# Patient Record
Sex: Male | Born: 1970 | Race: White | Hispanic: No | Marital: Married | Smoking: Light tobacco smoker
Health system: Southern US, Community
[De-identification: ages and names within clinical notes are randomized; demographics above are authoritative.]

## PROBLEM LIST (undated history)

## (undated) DIAGNOSIS — E785 Hyperlipidemia, unspecified: Secondary | ICD-10-CM

## (undated) DIAGNOSIS — I10 Essential (primary) hypertension: Secondary | ICD-10-CM

## (undated) DIAGNOSIS — E119 Type 2 diabetes mellitus without complications: Secondary | ICD-10-CM

## (undated) DIAGNOSIS — Q539 Undescended testicle, unspecified: Secondary | ICD-10-CM

## (undated) HISTORY — DX: Hyperlipidemia, unspecified: E78.5

## (undated) HISTORY — DX: Undescended testicle, unspecified: Q53.9

## (undated) HISTORY — DX: Type 2 diabetes mellitus without complications: E11.9

## (undated) HISTORY — DX: Essential (primary) hypertension: I10

## (undated) HISTORY — PX: TESTICLE SURGERY: SHX794

## (undated) HISTORY — PX: OTHER SURGICAL HISTORY: SHX169

---

## 2007-03-02 ENCOUNTER — Ambulatory Visit: Payer: Self-pay | Admitting: Family Medicine

## 2007-03-02 DIAGNOSIS — E781 Pure hyperglyceridemia: Secondary | ICD-10-CM | POA: Insufficient documentation

## 2007-03-02 DIAGNOSIS — H5310 Unspecified subjective visual disturbances: Secondary | ICD-10-CM | POA: Insufficient documentation

## 2007-03-09 ENCOUNTER — Encounter: Payer: Self-pay | Admitting: Family Medicine

## 2007-03-13 ENCOUNTER — Encounter: Payer: Self-pay | Admitting: Family Medicine

## 2007-03-13 LAB — CONVERTED CEMR LAB
ALT: 97 units/L — ABNORMAL HIGH (ref 0–53)
CO2: 23 meq/L (ref 19–32)
Calcium: 9.1 mg/dL (ref 8.4–10.5)
Chloride: 107 meq/L (ref 96–112)
Cholesterol: 219 mg/dL — ABNORMAL HIGH (ref 0–200)
Sodium: 142 meq/L (ref 135–145)
TSH: 2.992 microintl units/mL (ref 0.350–5.50)
Total Bilirubin: 0.5 mg/dL (ref 0.3–1.2)
Total Protein: 7 g/dL (ref 6.0–8.3)

## 2007-03-15 ENCOUNTER — Telehealth: Payer: Self-pay | Admitting: Family Medicine

## 2008-08-11 ENCOUNTER — Ambulatory Visit: Payer: Self-pay | Admitting: Family Medicine

## 2008-08-11 DIAGNOSIS — I10 Essential (primary) hypertension: Secondary | ICD-10-CM | POA: Insufficient documentation

## 2008-08-12 ENCOUNTER — Encounter: Payer: Self-pay | Admitting: Family Medicine

## 2008-08-12 LAB — CONVERTED CEMR LAB
ALT: 134 units/L — ABNORMAL HIGH (ref 0–53)
Albumin: 4.6 g/dL (ref 3.5–5.2)
Alkaline Phosphatase: 58 units/L (ref 39–117)
Glucose, Bld: 107 mg/dL — ABNORMAL HIGH (ref 70–99)
Potassium: 4.6 meq/L (ref 3.5–5.3)
Sodium: 142 meq/L (ref 135–145)
TSH: 1.867 microintl units/mL (ref 0.350–4.500)
Total Protein: 7.6 g/dL (ref 6.0–8.3)
Triglycerides: 557 mg/dL — ABNORMAL HIGH (ref <150)

## 2008-08-13 LAB — CONVERTED CEMR LAB
Hep A IgM: NEGATIVE
Hep B C IgM: NEGATIVE
Hepatitis B Surface Ag: NEGATIVE

## 2008-08-22 ENCOUNTER — Encounter: Payer: Self-pay | Admitting: Family Medicine

## 2008-08-25 ENCOUNTER — Telehealth: Payer: Self-pay | Admitting: Family Medicine

## 2008-12-29 ENCOUNTER — Encounter: Admission: RE | Admit: 2008-12-29 | Discharge: 2008-12-29 | Payer: Self-pay | Admitting: Family Medicine

## 2008-12-29 ENCOUNTER — Ambulatory Visit: Payer: Self-pay | Admitting: Family Medicine

## 2008-12-29 DIAGNOSIS — L259 Unspecified contact dermatitis, unspecified cause: Secondary | ICD-10-CM

## 2008-12-29 DIAGNOSIS — R05 Cough: Secondary | ICD-10-CM

## 2009-02-27 ENCOUNTER — Ambulatory Visit: Payer: Self-pay | Admitting: Family Medicine

## 2009-02-27 DIAGNOSIS — IMO0001 Reserved for inherently not codable concepts without codable children: Secondary | ICD-10-CM | POA: Insufficient documentation

## 2009-02-27 DIAGNOSIS — M2240 Chondromalacia patellae, unspecified knee: Secondary | ICD-10-CM

## 2011-06-16 ENCOUNTER — Telehealth: Payer: Self-pay | Admitting: Family Medicine

## 2011-06-16 ENCOUNTER — Encounter: Payer: Self-pay | Admitting: Family Medicine

## 2011-06-16 ENCOUNTER — Ambulatory Visit (INDEPENDENT_AMBULATORY_CARE_PROVIDER_SITE_OTHER): Payer: BC Managed Care – PPO | Admitting: Family Medicine

## 2011-06-16 VITALS — BP 150/101 | HR 83 | Wt 311.0 lb

## 2011-06-16 DIAGNOSIS — E669 Obesity, unspecified: Secondary | ICD-10-CM

## 2011-06-16 DIAGNOSIS — R5383 Other fatigue: Secondary | ICD-10-CM

## 2011-06-16 DIAGNOSIS — M25529 Pain in unspecified elbow: Secondary | ICD-10-CM

## 2011-06-16 DIAGNOSIS — F39 Unspecified mood [affective] disorder: Secondary | ICD-10-CM

## 2011-06-16 DIAGNOSIS — R5381 Other malaise: Secondary | ICD-10-CM

## 2011-06-16 DIAGNOSIS — M7710 Lateral epicondylitis, unspecified elbow: Secondary | ICD-10-CM

## 2011-06-16 NOTE — Telephone Encounter (Signed)
Call patient. He did screen positive on the mood questionnaire for depression. In fact he scored in the moderately severe range. I would like to discuss this further at his followup visit in 3-4 weeks or he can certainly come in sooner if he would like.

## 2011-06-16 NOTE — Progress Notes (Signed)
Subjective:    Patient ID: Adam Santos, male    DOB: 27-Jul-1970, 41 y.o.   MRN: [id number]  HPI Pain in the left elbow for about 2 months.Says worse with lifting milk jug out of the fridge.   Says it does not bother him constantly that comes and goes. It seems to bother him when he just does a certain turning or twisting movement of his arm and then it will certainly be a very severe pain to the point of bringing him almost to tears. He says he has a very high pain tolerance for him this is very unusual. He has not really been taking any medications for it. He denies any actual trauma or impact injury to the elbow.   has had 2 episodes of eyelid swelling after exercise. Last time was two weeks ago. Swelling resolved by next day.  No itching.  No CP or SOB. Did use benadryl after the swelling. 100% back to normal. No hx of allergies, eczema, or asthma.  He denies any abnormal contact with chemicals etc. to the eye.  Weight gain - Tried eating differently and trying to exercise but still only able to lose a few pounds. He feels like it should be coming off more quickly he is not sure why. She wonders if there could be an underlying reason for why he cannot lose weight.. He does complain of Fatigue. Has dec mood but says it is intermittent. He says he will have several days in a row where he feels down and apathetic and doesn't want to work but then the next day he feels like he snaps out of it.. Wants test checked.  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   Review of Systems BP 150/101  Pulse 83  Wt 311 lb (141.069 kg)    Allergies not on file  Past Medical History  Diagnosis Date  . Hyperlipidemia     Past Surgical History  Procedure Date  . Tonsills removed     History   Social History  . Marital Status: Married    Spouse Name: N/A   Number of Children: N/A  . Years of Education: N/A   Occupational History  . Not on file.   Social History Main Topics  . Smoking status: Never Smoker   . Smokeless tobacco: Not on file  . Alcohol Use: Yes  . Drug Use: No  . Sexually Active: Yes -- Male partner(s)   Other Topics Concern  . Not on file   Social History Narrative  . No narrative on file    Family History  Problem Relation Age of Onset  . Heart attack Father   . Hypertension Father         Objective:   Physical Exam  Constitutional: He appears well-developed and well-nourished.  Musculoskeletal:       He is very tender over the left lateral epicondyle. He is also tender a little bit along the tendon attaches. He has pain with supination against resistance. He also has pain with dorsiflexion of the hand against resistance. He has no pinpoint tenderness or swelling over the actual olecranon. No skin lesions or erythema. Normal range of motion of the shoulder, elbow, wrist.  Psychiatric: He has a normal mood and affect. His behavior is normal.  Assessment & Plan:  Left lateral epicondulitis - Discussed tx with NSAIDs, ice, stretches and strap. H.O. Given. F/U in 3-4 weeks.   Obesity - Discussed will check TSH and testorone. If normal consider a weight looss program such at [employer] , personal trainer or wokring with a nutritionist.  It is difficult to assess truly his effort at this point. I certainly think that some mild depression could be contributing.   It sounds like his recent apathy could be some mild depression. I did have him complete a PHQ 9 today and he scored an 18. This is significant for moderately severe depression. I would like to discuss this further with him and his followup in 3-4 weeks.  Fatigue-we will certainly check lab work such as TSH and testosterone but I certainly think that he could be coming from depression. I did have him complete a testosterone questionnaire and  he scored positive for 5/ 10 questions today.

## 2011-06-16 NOTE — Patient Instructions (Addendum)
Aleve twice a day with food and water to avoid GI symptoms Ice twice a day. Can wear a strap.   Lateral Epicondylitis (Tennis Elbow) with Rehab Lateral epicondylitis involves inflammation and pain around the outer portion of the elbow. The pain is caused by inflammation of the tendons in the forearm that bring back (extend) the wrist. Lateral epicondylittis is also called tennis elbow, because it is very common in tennis players. However, it may occur in any individual who extends the wrist repetitively. If lateral epicondylitis is left untreated, it may become a chronic problem. SYMPTOMS    Pain, tenderness, and inflammation on the outer (lateral) side of the elbow.     Pain or weakness with gripping activities.     Pain that increases with wrist twisting motions (playing tennis, using a screwdriver, opening a door or a jar).     Pain with lifting objects, including a coffee cup.  CAUSES   Lateral epicondylitis is caused by inflammation of the tendons that extend the wrist. Causes of injury may include:  Repetitive stress and strain on the muscles and tendons that extend the wrist.     Sudden change in activity level or intensity.     Incorrect grip in racquet sports.     Incorrect grip size of racquet (often too large).     Incorrect hitting position or technique (usually backhand, leading with the elbow).     Using a racket that is too heavy.  RISK INCREASES WITH:  Sports or occupations that require repetitive and/or strenuous forearm and wrist movements (tennis, squash, racquetball, carpentry).     Poor wrist and forearm strength and flexibility.     Failure to warm up properly before activity.     Resuming activity before healing, rehabilitation, and conditioning are complete.  PREVENTION    Warm up and stretch properly before activity.     Maintain physical fitness:     Strength, flexibility, and endurance.     Cardiovascular fitness.     Wear and use properly  fitted equipment.     Learn and use proper technique and have a coach correct improper technique.     Wear a tennis elbow (counterforce) brace.  PROGNOSIS   The course of this condition depends on the degree of the injury. If treated properly, acute cases (symptoms lasting less than 4 weeks) are often resolved in 2 to 6 weeks. Chronic (longer lasting cases) often resolve in 3 to 6 months, but may require physical therapy. RELATED COMPLICATIONS    Frequently recurring symptoms, resulting in a chronic problem. Properly treating the problem the first time decreases frequency of recurrence.     Chronic inflammation, scarring tendon degeneration, and partial tendon tear, requiring surgery.     Delayed healing or resolution of symptoms.  TREATMENT   Treatment first involves the use of ice and medicine, to reduce pain and inflammation. Strengthening and stretching exercises may help reduce discomfort, if performed regularly. These exercises may be performed at home, if the condition is an acute injury. Chronic cases may require a referral to a physical therapist for evaluation and treatment. Your caregiver may advise a corticosteroid injection, to help reduce inflammation. Rarely, surgery is needed. MEDICATION  If pain medicine is needed, nonsteroidal anti-inflammatory medicines (aspirin and ibuprofen), or other minor pain relievers (acetaminophen), are often advised.     Do not take pain medicine for 7 days before surgery.     Prescription pain relievers may be given, if your caregiver thinks  they are needed. Use only as directed and only as much as you need.     Corticosteroid injections may be recommended. These injections should be reserved only for the most severe cases, because they can only be given a certain number of times.  HEAT AND COLD  Cold treatment (icing) should be applied for 10 to 15 minutes every 2 to 3 hours for inflammation and pain, and immediately after activity that  aggravates your symptoms. Use ice packs or an ice massage.     Heat treatment may be used before performing stretching and strengthening activities prescribed by your caregiver, physical therapist, or athletic trainer. Use a heat pack or a warm water soak.  SEEK MEDICAL CARE IF: Symptoms get worse or do not improve in 2 weeks, despite treatment. EXERCISES   RANGE OF MOTION (ROM) AND STRETCHING EXERCISES - Epicondylitis, Lateral (Tennis Elbow) These exercises may help you when beginning to rehabilitate your injury. Your symptoms may go away with or without further involvement from your physician, physical therapist or athletic trainer. While completing these exercises, remember:    Restoring tissue flexibility helps normal motion to return to the joints. This allows healthier, less painful movement and activity.     An effective stretch should be held for at least 30 seconds.     A stretch should never be painful. You should only feel a gentle lengthening or release in the stretched tissue.  RANGE OF MOTION - Wrist Flexion, Active-Assisted  Extend your right / left elbow with your fingers pointing down.*     Gently pull the back of your hand towards you, until you feel a gentle stretch on the top of your forearm.     Hold this position for __________ seconds.  Repeat __________ times. Complete this exercise __________ times per day.   *If directed by your physician, physical therapist or athletic trainer, complete this stretch with your elbow bent, rather than extended. RANGE OF MOTION - Wrist Extension, Active-Assisted  Extend your right / left elbow and turn your palm upwards.*     Gently pull your palm and fingertips back, so your wrist extends and your fingers point more toward the ground.     You should feel a gentle stretch on the inside of your forearm.     Hold this position for __________ seconds.  Repeat __________ times. Complete this exercise __________ times per day. *If  directed by your physician, physical therapist or athletic trainer, complete this stretch with your elbow bent, rather than extended. STRETCH - Wrist Flexion  Place the back of your right / left hand on a tabletop, leaving your elbow slightly bent. Your fingers should point away from your body.     Gently press the back of your hand down onto the table by straightening your elbow. You should feel a stretch on the top of your forearm.     Hold this position for __________ seconds.  Repeat __________ times. Complete this stretch __________ times per day.   STRETCH - Wrist Extension   Place your right / left fingertips on a tabletop, leaving your elbow slightly bent. Your fingers should point backwards.     Gently press your fingers and palm down onto the table by straightening your elbow. You should feel a stretch on the inside of your forearm.     Hold this position for __________ seconds.  Repeat __________ times. Complete this stretch __________ times per day.   STRENGTHENING EXERCISES - Epicondylitis, Lateral (Tennis Elbow)  These exercises may help you when beginning to rehabilitate your injury. They may resolve your symptoms with or without further involvement from your physician, physical therapist or athletic trainer. While completing these exercises, remember:    Muscles can gain both the endurance and the strength needed for everyday activities through controlled exercises.     Complete these exercises as instructed by your physician, physical therapist or athletic trainer. Increase the resistance and repetitions only as guided.     You may experience muscle soreness or fatigue, but the pain or discomfort you are trying to eliminate should never worsen during these exercises. If this pain does get worse, stop and make sure you are following the directions exactly. If the pain is still present after adjustments, discontinue the exercise until you can discuss the trouble with your  caregiver.  STRENGTH - Wrist Flexors  Sit with your right / left forearm palm-up and fully supported on a table or countertop. Your elbow should be resting below the height of your shoulder. Allow your wrist to extend over the edge of the surface.     Loosely holding a __________ weight, or a piece of rubber exercise band or tubing, slowly curl your hand up toward your forearm.     Hold this position for __________ seconds. Slowly lower the wrist back to the starting position in a controlled manner.  Repeat __________ times. Complete this exercise __________ times per day.   STRENGTH - Wrist Extensors  Sit with your right / left forearm palm-down and fully supported on a table or countertop. Your elbow should be resting below the height of your shoulder. Allow your wrist to extend over the edge of the surface.     Loosely holding a __________ weight, or a piece of rubber exercise band or tubing, slowly curl your hand up toward your forearm.     Hold this position for __________ seconds. Slowly lower the wrist back to the starting position in a controlled manner.  Repeat __________ times. Complete this exercise __________ times per day.   STRENGTH - Ulnar Deviators  Stand with a ____________________ weight in your right / left hand, or sit while holding a rubber exercise band or tubing, with your healthy arm supported on a table or countertop.     Move your wrist, so that your pinkie travels toward your forearm and your thumb moves away from your forearm.     Hold this position for __________ seconds and then slowly lower the wrist back to the starting position.  Repeat __________ times. Complete this exercise __________ times per day STRENGTH - Radial Deviators  Stand with a ____________________ weight in your right / left hand, or sit while holding a rubber exercise band or tubing, with your injured arm supported on a table or countertop.     Raise your hand upward in front of you or  pull up on the rubber tubing.     Hold this position for __________ seconds and then slowly lower the wrist back to the starting position.  Repeat __________ times. Complete this exercise __________ times per day. STRENGTH - Forearm Supinators   Sit with your right / left forearm supported on a table, keeping your elbow below shoulder height. Rest your hand over the edge, palm down.     Gently grip a hammer or a soup ladle.     Without moving your elbow, slowly turn your palm and hand upward to a "thumbs-up" position.     Hold this  position for __________ seconds. Slowly return to the starting position.  Repeat __________ times. Complete this exercise __________ times per day.   STRENGTH - Forearm Pronators   Sit with your right / left forearm supported on a table, keeping your elbow below shoulder height. Rest your hand over the edge, palm up.     Gently grip a hammer or a soup ladle.     Without moving your elbow, slowly turn your palm and hand upward to a "thumbs-up" position.     Hold this position for __________ seconds. Slowly return to the starting position.  Repeat __________ times. Complete this exercise __________ times per day.   STRENGTH - Grip  Grasp a tennis ball, a dense sponge, or a large, rolled sock in your hand.     Squeeze as hard as you can, without increasing any pain.     Hold this position for __________ seconds. Release your grip slowly.  Repeat __________ times. Complete this exercise __________ times per day.   STRENGTH - Elbow Extensors, Isometric  Stand or sit upright, on a firm surface. Place your right / left arm so that your palm faces your stomach, and it is at the height of your waist.     Place your opposite hand on the underside of your forearm. Gently push up as your right / left arm resists. Push as hard as you can with both arms, without causing any pain or movement at your right / left elbow. Hold this stationary position for __________  seconds.  Gradually release the tension in both arms. Allow your muscles to relax completely before repeating. Document Released: 05/02/2005 Document Revised: 01/12/2011 Document Reviewed: 08/14/2008 [facility] Patient Information 2012 [place], [place].

## 2011-06-17 LAB — TESTOSTERONE, FREE, TOTAL, SHBG
Testosterone, Free: 59 pg/mL (ref 47.0–244.0)
Testosterone-% Free: 2.8 % (ref 1.6–2.9)

## 2011-06-20 NOTE — Telephone Encounter (Signed)
Pt has been notified of this and will discuss at f/u appt

## 2011-06-27 ENCOUNTER — Telehealth: Payer: Self-pay | Admitting: *Deleted

## 2011-06-27 DIAGNOSIS — M25529 Pain in unspecified elbow: Secondary | ICD-10-CM

## 2011-06-27 NOTE — Telephone Encounter (Signed)
Pt informed

## 2011-06-27 NOTE — Telephone Encounter (Signed)
Pt states that he has had constant pain x4 days in his elbow and would like to get an Xray done.

## 2011-06-27 NOTE — Telephone Encounter (Signed)
Order in computer. He can go anytime.

## 2011-06-28 ENCOUNTER — Ambulatory Visit
Admission: RE | Admit: 2011-06-28 | Discharge: 2011-06-28 | Disposition: A | Discharge status: Home / Self Care | Payer: BC Managed Care – PPO | Source: Ambulatory Visit | Attending: Family Medicine | Admitting: Family Medicine

## 2011-06-28 DIAGNOSIS — M7710 Lateral epicondylitis, unspecified elbow: Secondary | ICD-10-CM

## 2011-06-28 DIAGNOSIS — E669 Obesity, unspecified: Secondary | ICD-10-CM

## 2011-06-28 DIAGNOSIS — M25529 Pain in unspecified elbow: Secondary | ICD-10-CM

## 2011-06-28 DIAGNOSIS — R5383 Other fatigue: Secondary | ICD-10-CM

## 2011-06-30 ENCOUNTER — Telehealth: Payer: Self-pay | Admitting: *Deleted

## 2011-06-30 ENCOUNTER — Other Ambulatory Visit: Payer: Self-pay | Admitting: Family Medicine

## 2011-07-04 LAB — TESTOSTERONE, FREE, TOTAL, SHBG
Sex Hormone Binding: 19 nmol/L (ref 13–71)
Testosterone, Free: 56.7 pg/mL (ref 47.0–244.0)
Testosterone-% Free: 2.5 % (ref 1.6–2.9)
Testosterone: 222.96 ng/dL — ABNORMAL LOW (ref 250–890)

## 2011-07-08 ENCOUNTER — Ambulatory Visit (INDEPENDENT_AMBULATORY_CARE_PROVIDER_SITE_OTHER): Payer: BC Managed Care – PPO | Admitting: Family Medicine

## 2011-07-08 ENCOUNTER — Ambulatory Visit
Admission: RE | Admit: 2011-07-08 | Discharge: 2011-07-08 | Disposition: A | Discharge status: Home / Self Care | Payer: BC Managed Care – PPO | Source: Ambulatory Visit | Attending: Family Medicine | Admitting: Family Medicine

## 2011-07-08 ENCOUNTER — Encounter: Payer: Self-pay | Admitting: Family Medicine

## 2011-07-08 VITALS — BP 139/90 | HR 84 | Ht 74.0 in | Wt 312.0 lb

## 2011-07-08 DIAGNOSIS — M25572 Pain in left ankle and joints of left foot: Secondary | ICD-10-CM

## 2011-07-08 DIAGNOSIS — M7710 Lateral epicondylitis, unspecified elbow: Secondary | ICD-10-CM

## 2011-07-08 DIAGNOSIS — M79673 Pain in unspecified foot: Secondary | ICD-10-CM

## 2011-07-08 DIAGNOSIS — M25579 Pain in unspecified ankle and joints of unspecified foot: Secondary | ICD-10-CM

## 2011-07-08 DIAGNOSIS — M7712 Lateral epicondylitis, left elbow: Secondary | ICD-10-CM

## 2011-07-08 DIAGNOSIS — E291 Testicular hypofunction: Secondary | ICD-10-CM

## 2011-07-08 DIAGNOSIS — M2550 Pain in unspecified joint: Secondary | ICD-10-CM

## 2011-07-08 DIAGNOSIS — M79609 Pain in unspecified limb: Secondary | ICD-10-CM

## 2011-07-08 MED ORDER — TESTOSTERONE 20.25 MG/1.25GM (1.62%) TD GEL: 2.0000 | TRANSDERMAL | Status: DC

## 2011-07-08 NOTE — Patient Instructions (Signed)
We will call you with your lab results. If you don't here from us in about a week then please give us a call at [phone].  

## 2011-07-08 NOTE — Progress Notes (Signed)
  Subjective:    Patient ID: JAMAINE QUINTIN, male    DOB: 05-22-1970, 41 y.o.   MRN: [id number]  HPI Left elbow pain. Tried Aleve and the elbow strap - they are not helping.  Did an xray and he had a bone spur. Exercises were making worse.   Woke up pain in his left foot AM. Mostly over the  the anterior hee and wraps around the the lateral malleolus.  . IT is mildy swollen and tender.  No redness. Hasn't had this before. Has not bee exercising or walking a lot.  Did wear dress shoes for 3 days in a row.  It was hard to walk on it this AM.  Took Alevel this AM.   Hypogonadism-he's also to review his lab results. He had 2 tests with low testosterone levels. He has been feeling fatigued low energy and decreased mood. Given copy of labs.   Review of Systems     Objective:   Physical Exam Lelft foot with tenderness along the anterior edge of heel and anterior to the lateral malleolus. It does feel warm to touch near the malleolus. Trace swelling laterally. No redness or wounds.         Assessment & Plan:  Left lateral epicondylitis with bone spur. Will refer to ortho/sport med for further evlauation and treatment.since not improving with conservative tx. Stop the exercises since exacerbating his sxs.   Left ankle/heel pain - will get xray to rule out stress fracture. Also will check uric acid and sed rate. Consider gout. Continue to use Aleve prn. Also consider plantar fascitis.  He has only had pain for one days so time may reveal a more precise diagnosis.   Hypogonadism - discussed options for testosterone replacement including the topical gels and the injections. I discussed the AndroGel and Axiron . He decided to start with the AndroGel. He was given a coupon card. F/u in 8 weeks to retest testosterone and PSA and liver function. We discussed the risks and benefits of the medication.Chec PSA today.

## 2011-07-11 ENCOUNTER — Telehealth: Payer: Self-pay | Admitting: *Deleted

## 2011-07-11 ENCOUNTER — Other Ambulatory Visit: Payer: Self-pay | Admitting: Family Medicine

## 2011-07-11 DIAGNOSIS — M79673 Pain in unspecified foot: Secondary | ICD-10-CM

## 2011-07-11 MED ORDER — HYDROCODONE-ACETAMINOPHEN 5-325 MG PO TABS: 2.0000 | ORAL_TABLET | Freq: Three times a day (TID) | ORAL | Status: DC | PRN

## 2011-07-11 NOTE — Telephone Encounter (Signed)
Pt states that he is having lots of pain in his elbow and doesn't see ortho until Friday. Would like to know if we can call him something for pain.

## 2011-07-11 NOTE — Telephone Encounter (Signed)
rx printed  Will fax

## 2011-07-12 NOTE — Telephone Encounter (Signed)
Pt notified med sent via VM. KJ LPN

## 2011-07-14 ENCOUNTER — Ambulatory Visit: Payer: BC Managed Care – PPO | Admitting: Family Medicine

## 2011-07-15 ENCOUNTER — Other Ambulatory Visit: Payer: BC Managed Care – PPO

## 2011-07-15 ENCOUNTER — Other Ambulatory Visit: Payer: Self-pay | Admitting: Orthopedic Surgery

## 2011-07-15 DIAGNOSIS — M25522 Pain in left elbow: Secondary | ICD-10-CM

## 2011-07-19 ENCOUNTER — Other Ambulatory Visit: Payer: Self-pay | Admitting: *Deleted

## 2011-07-19 MED ORDER — HYDROCODONE-ACETAMINOPHEN 5-325 MG PO TABS: 2.0000 | ORAL_TABLET | Freq: Three times a day (TID) | ORAL | Status: DC | PRN

## 2011-07-19 NOTE — Telephone Encounter (Signed)
Rx printed and faxed.  

## 2011-07-19 NOTE — Telephone Encounter (Signed)
Pt calls and states going out of town leaving tomorrow morning and wanted to know if he could get a refill on the pain med. He only has 2 left and his surgery is scheduled March 20th.

## 2011-07-19 NOTE — Telephone Encounter (Signed)
Ok to refill 

## 2011-07-25 ENCOUNTER — Telehealth: Payer: Self-pay | Admitting: *Deleted

## 2011-07-25 MED ORDER — HYDROCODONE-ACETAMINOPHEN 5-325 MG PO TABS: 2.0000 | ORAL_TABLET | Freq: Three times a day (TID) | ORAL | Status: DC | PRN

## 2011-07-25 NOTE — Telephone Encounter (Signed)
OK for one more refill.

## 2011-07-25 NOTE — Telephone Encounter (Signed)
Pt states he would like a refill on Norco. States that he is having surgery on the 21st and is out of pain meds. May we refill them?

## 2011-07-25 NOTE — Telephone Encounter (Signed)
RX printed 

## 2011-08-01 ENCOUNTER — Other Ambulatory Visit: Payer: Self-pay | Admitting: *Deleted

## 2011-08-01 MED ORDER — HYDROCODONE-ACETAMINOPHEN 5-325 MG PO TABS: 2.0000 | ORAL_TABLET | Freq: Four times a day (QID) | ORAL | Status: AC | PRN

## 2011-08-01 NOTE — Telephone Encounter (Signed)
Pt informed

## 2011-08-01 NOTE — Telephone Encounter (Signed)
New rx sent to take 4 a day.

## 2011-08-01 NOTE — Telephone Encounter (Signed)
Pt has called requesting another refill on the pain med. States he is having surgery on Thursday. Last refill done on 07/25/11 for 20 pills. please advise.

## 2011-11-15 ENCOUNTER — Other Ambulatory Visit: Payer: Self-pay | Admitting: Family Medicine

## 2011-12-17 ENCOUNTER — Other Ambulatory Visit: Payer: Self-pay | Admitting: Family Medicine

## 2012-01-17 ENCOUNTER — Other Ambulatory Visit: Payer: Self-pay | Admitting: Family Medicine

## 2012-01-17 MED ORDER — TESTOSTERONE 20.25 MG/ACT (1.62%) TD GEL: 2.0000 | Freq: Every morning | TRANSDERMAL | Status: DC

## 2012-01-18 ENCOUNTER — Ambulatory Visit (INDEPENDENT_AMBULATORY_CARE_PROVIDER_SITE_OTHER): Payer: BC Managed Care – PPO | Admitting: Family Medicine

## 2012-01-18 ENCOUNTER — Other Ambulatory Visit: Payer: Self-pay | Admitting: Family Medicine

## 2012-01-18 ENCOUNTER — Encounter: Payer: Self-pay | Admitting: Family Medicine

## 2012-01-18 ENCOUNTER — Ambulatory Visit (INDEPENDENT_AMBULATORY_CARE_PROVIDER_SITE_OTHER): Payer: BC Managed Care – PPO

## 2012-01-18 VITALS — BP 160/108 | HR 72 | Wt 310.0 lb

## 2012-01-18 DIAGNOSIS — M25529 Pain in unspecified elbow: Secondary | ICD-10-CM

## 2012-01-18 DIAGNOSIS — M25521 Pain in right elbow: Secondary | ICD-10-CM

## 2012-01-18 DIAGNOSIS — E785 Hyperlipidemia, unspecified: Secondary | ICD-10-CM

## 2012-01-18 DIAGNOSIS — R030 Elevated blood-pressure reading, without diagnosis of hypertension: Secondary | ICD-10-CM

## 2012-01-18 DIAGNOSIS — E291 Testicular hypofunction: Secondary | ICD-10-CM

## 2012-01-18 DIAGNOSIS — I10 Essential (primary) hypertension: Secondary | ICD-10-CM

## 2012-01-18 MED ORDER — LISINOPRIL 20 MG PO TABS: 20.0000 mg | ORAL_TABLET | Freq: Every day | ORAL | Status: DC

## 2012-01-18 NOTE — Patient Instructions (Addendum)
We will call you with your lab results. If you don't here from us in about a week then please give us a call at [phone].  

## 2012-01-18 NOTE — Progress Notes (Signed)
Subjective:    Patient ID: Adam Santos, male    DOB: 21-Dec-1970, 41 y.o.   MRN: [id number]  HPI Hypogonadism - No SE on the androgel.  Says thought he would lose weight.  Not exercising.  Drinking about 4-5 days per week., 2-3 drinks liquor. Feels angry all the time. Does have  A high stress job.  Says not change in level of stress for work.  He is feeling apathetic. Denies feeling sad per se. Sleep is okay. He does not feel motivated to exercise. He overall feels that he does eat healthy except for the recent increase in alcohol intake.  HTN - No CP or SOb. No dizziness.  Taking medications regularly.  Right elbow pain-see below.   Review of Systems  BP 160/108  Pulse 72  Wt 310 lb (140.615 kg)    No Known Allergies  Past Medical History  Diagnosis Date  . Hyperlipidemia     Past Surgical History  Procedure Date  . Tonsills removed     History   Social History  . Marital Status: Married    Spouse Name: N/A    Number of Children: N/A  . Years of Education: N/A   Occupational History  . Not on file.   Social History Main Topics  . Smoking status: Never Smoker   . Smokeless tobacco: Not on file  . Alcohol Use: Yes  . Drug Use: No  . Sexually Active: Yes -- Male partner(s)   Other Topics Concern  . Not on file   Social History Narrative  . No narrative on file    Family History  Problem Relation Age of Onset  . Heart attack Father   . Hypertension Father     Outpatient Encounter Prescriptions as of 01/18/2012  Medication Sig Dispense Refill  . HYDROcodone-acetaminophen (NORCO) 5-325 MG per tablet Take 2 tablets by mouth every 6 (six) hours as needed for pain.  20 tablet  0  . Testosterone (ANDROGEL PUMP) 20.25 MG/ACT (1.62%) GEL Apply 2 Act topically every morning.  75 g  0  . Testosterone (ANDROGEL) 20.25 MG/1.25GM (1.62%) GEL Place 2 Act onto the skin every morning.  1.25 g  3  . lisinopril (PRINIVIL,ZESTRIL) 20 MG tablet Take 1 tablet (20 mg total)  by mouth daily.  30 tablet  1          Objective:   Physical Exam  Constitutional: He is oriented to person, place, and time. He appears well-developed and well-nourished.  HENT:  Head: Normocephalic and atraumatic.  Neck: Neck supple. No thyromegaly present.  Cardiovascular: Normal rate, regular rhythm and normal heart sounds.   Pulmonary/Chest: Effort normal and breath sounds normal.  Lymphadenopathy:    He has no cervical adenopathy.  Neurological: He is alert and oriented to person, place, and time.  Skin: Skin is warm and dry.  Psychiatric: He has a normal mood and affect. His behavior is normal.          Assessment & Plan:  HTN- He really doesn't want to take a BP pill.  He says he really wants to work on diet and exercise.  I explained that o has been up for over 6 months and that causes damage over time.  I did discuss starting a medication as he continues to work on diet exercise and weight loss then we can wean the medication if tolerated. We will start with lisinopril. Followup in 3-4 weeks. I think part of his lack of motivation to  exercise is from his adenopathy.  Irritability/apathy- May be from the testosterone vs depression. We discussed the option of stopping the testosterone if his level is actually normal. If his mood does not improve off of the testosterone then consider therapy or medication to treat depression.  Right elbow Pain - he is now having right elbow pain. Previously he had left elbow pain and we found a bone spur on x-ray. He was referred to orthopedist and he actually had surgery. A couple weeks ago he started having very similar pain in the right elbow. He would like to go back and see Dr. Orlan Leavens for further workup and evaluation. He says he would like to go ahead and get an x-ray today.  Hypogonadism-certainly that is her ability to be coming from the hormone itself. I like to recheck his testosterone level today. He did apply the gel today. If his  levels are too high we can decrease the dose and see if that makes a difference. If his levels are completely normal then I recommend stopping the supplement.

## 2012-01-19 ENCOUNTER — Telehealth: Payer: Self-pay | Admitting: *Deleted

## 2012-01-19 LAB — LIPID PANEL
Cholesterol: 201 mg/dL — ABNORMAL HIGH (ref 0–200)
HDL: 43 mg/dL (ref >39)
Total CHOL/HDL Ratio: 4.7 Ratio

## 2012-01-19 LAB — COMPLETE METABOLIC PANEL WITH GFR
AST: 39 U/L — ABNORMAL HIGH (ref 0–37)
Albumin: 4.3 g/dL (ref 3.5–5.2)
Alkaline Phosphatase: 56 U/L (ref 39–117)
GFR, Est Non African American: 75 mL/min
Potassium: 4.6 mEq/L (ref 3.5–5.3)
Sodium: 139 mEq/L (ref 135–145)
Total Bilirubin: 0.9 mg/dL (ref 0.3–1.2)
Total Protein: 7 g/dL (ref 6.0–8.3)

## 2012-01-19 LAB — TESTOSTERONE, FREE, TOTAL, SHBG
Sex Hormone Binding: 19 nmol/L (ref 13–71)
Testosterone, Free: 48.1 pg/mL (ref 47.0–244.0)
Testosterone: 191.07 ng/dL — ABNORMAL LOW (ref 300–890)

## 2012-01-19 LAB — LDL CHOLESTEROL, DIRECT: Direct LDL: 65 mg/dL

## 2012-01-19 LAB — TSH: TSH: 1.491 u[IU]/mL (ref 0.350–4.500)

## 2012-01-19 NOTE — Telephone Encounter (Signed)
Pt notified of results and added direct LDL

## 2012-02-01 ENCOUNTER — Telehealth: Payer: Self-pay | Admitting: *Deleted

## 2012-02-01 NOTE — Telephone Encounter (Signed)
Left message for pt to call to follow up on elbow X-Ray

## 2012-02-01 NOTE — Telephone Encounter (Signed)
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 No spurs. If he would like to consider an injection we could see if he was interested in seeing Dr. Benjamin Stain. If he would still like to see Dr. Melvyn Novas have early place a referral for this.

## 2012-02-01 NOTE — Telephone Encounter (Signed)
Talked with patient on elbow x-ray and he has a appointment this week with his Ortho MD and will see him  Offer appointment here but said he would wait

## 2012-02-01 NOTE — Telephone Encounter (Signed)
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 No spurs. If he would like to consider an injection we could see if he was interested in seeing Dr. Benjamin Stain. If he would still like to see Dr. Melvyn Novas have early place a referral for this.

## 2012-02-02 ENCOUNTER — Telehealth: Payer: Self-pay | Admitting: *Deleted

## 2012-02-02 NOTE — Telephone Encounter (Signed)
Is as an oral tab, or injection?

## 2012-02-02 NOTE — Telephone Encounter (Signed)
Pt advised MD does not recommend.

## 2012-02-02 NOTE — Telephone Encounter (Signed)
I wouldn't recommend it.

## 2012-02-02 NOTE — Telephone Encounter (Signed)
Wants to know if it is ok to take HCG supplement with B/P medication that you put him on?

## 2012-02-02 NOTE — Telephone Encounter (Signed)
States it's gtts

## 2012-02-14 ENCOUNTER — Other Ambulatory Visit: Payer: Self-pay | Admitting: Family Medicine

## 2012-02-15 ENCOUNTER — Ambulatory Visit: Payer: BC Managed Care – PPO | Admitting: Family Medicine

## 2012-02-15 DIAGNOSIS — Z0289 Encounter for other administrative examinations: Secondary | ICD-10-CM

## 2012-02-20 ENCOUNTER — Ambulatory Visit (INDEPENDENT_AMBULATORY_CARE_PROVIDER_SITE_OTHER): Payer: BC Managed Care – PPO | Admitting: Family Medicine

## 2012-02-20 ENCOUNTER — Encounter: Payer: Self-pay | Admitting: Family Medicine

## 2012-02-20 VITALS — BP 118/76 | HR 69 | Wt 289.0 lb

## 2012-02-20 DIAGNOSIS — R748 Abnormal levels of other serum enzymes: Secondary | ICD-10-CM

## 2012-02-20 DIAGNOSIS — E291 Testicular hypofunction: Secondary | ICD-10-CM

## 2012-02-20 DIAGNOSIS — E785 Hyperlipidemia, unspecified: Secondary | ICD-10-CM

## 2012-02-20 DIAGNOSIS — Z23 Encounter for immunization: Secondary | ICD-10-CM

## 2012-02-20 DIAGNOSIS — I10 Essential (primary) hypertension: Secondary | ICD-10-CM

## 2012-02-20 LAB — HEPATIC FUNCTION PANEL
Alkaline Phosphatase: 47 U/L (ref 39–117)
Bilirubin, Direct: 0.2 mg/dL (ref 0.0–0.3)
Indirect Bilirubin: 0.8 mg/dL (ref 0.0–0.9)
Total Protein: 7 g/dL (ref 6.0–8.3)

## 2012-02-20 LAB — LIPID PANEL
HDL: 30 mg/dL — ABNORMAL LOW (ref >39)
LDL Cholesterol: 185 mg/dL — ABNORMAL HIGH (ref 0–99)
Triglycerides: 203 mg/dL — ABNORMAL HIGH (ref <150)
VLDL: 41 mg/dL — ABNORMAL HIGH (ref 0–40)

## 2012-02-20 NOTE — Patient Instructions (Addendum)
Schedule nurse visit in 1-2 weeks for BP.  Cut lisinopril in half.

## 2012-02-20 NOTE — Progress Notes (Signed)
  Subjective:    Patient ID: Adam Santos, male    DOB: 1970-12-26, 41 y.o.   MRN: [id number]  HPI HTN - No CP or SOB.  Has been walking 4 miles a day.  Has lost 21 lbs and is doing well.  He didn't take his lisinopril today.   Hypogonadism-we recently checked his testosterone level was still low so I had him increase to 4 pounds daily on the medication. But he says he only used 2 today but previously has been using for her. I'm not sure why he did a little less today so it may affect the actual lab draw that we are doing today.  Hyperlipidemia-he is medication that has been trying to lose weight and exercise. He would like to recheck his levels today.  Review of Systems     Objective:   Physical Exam  Constitutional: He is oriented to person, place, and time. He appears well-developed and well-nourished.  HENT:  Head: Normocephalic and atraumatic.  Cardiovascular: Normal rate, regular rhythm and normal heart sounds.   Pulmonary/Chest: Effort normal and breath sounds normal.  Neurological: He is alert and oriented to person, place, and time.  Skin: Skin is warm and dry.  Psychiatric: He has a normal mood and affect. His behavior is normal.          Assessment & Plan:  HTN - Very well controlled today.  Off the lisinopril.  We discussed cutting the lisinopril in half for the next 2 weeks and then rechecking the blood pressures if he still well controlled. I think a significant weight loss is a be contributed to why his blood pressures better today. Keep up the good work and continue exercise and diet.  Hyperlipidemia -he would like to recheck his levels now that he's had some significant weight loss to see if there improvement. He especially has trouble with his triglycerides.  Hypogonadism-recheck testosterone levels today. Though they may be a slightly off because he only did 2 pumps instead of 4 today. Also exam importance of applying in the morning to [employer]  outfraction of higher testosterone levels in the morning.  Flu vaccine given today.

## 2012-02-21 LAB — TESTOSTERONE, FREE, TOTAL, SHBG
Testosterone, Free: 65.9 pg/mL (ref 47.0–244.0)
Testosterone-% Free: 2.5 % (ref 1.6–2.9)

## 2012-02-27 ENCOUNTER — Ambulatory Visit (INDEPENDENT_AMBULATORY_CARE_PROVIDER_SITE_OTHER): Payer: BC Managed Care – PPO | Admitting: Family Medicine

## 2012-02-27 VITALS — BP 118/74

## 2012-02-27 DIAGNOSIS — I10 Essential (primary) hypertension: Secondary | ICD-10-CM

## 2012-02-27 NOTE — Progress Notes (Signed)
LMOM for pt to return call. 

## 2012-02-27 NOTE — Progress Notes (Signed)
  Subjective:    Patient ID: Adam Santos, male    DOB: 1971-03-27, 41 y.o.   MRN: [id number] Pt denies CP, SOB, dizziness, or heart palpitations. taking meds as directed without problems. Denies med side effects. 5 min spent with pt. Pt states he has been taking Lisinopril 10mg  daily for 1 week.  HPI    Review of Systems     Objective:   Physical Exam        Assessment & Plan:  HTN- BP is awesome!!! Let stop the lisinopirl completely and recheck BP in one month to see if still looks good off the medication.  Nani Gasser, MD

## 2012-02-28 NOTE — Progress Notes (Signed)
  Subjective:    Patient ID: Adam Santos, male    DOB: 06-Mar-1971, 41 y.o.   MRN: [id number]  HPI    Review of Systems     Objective:   Physical Exam        Assessment & Plan:  Pt notified of MD instructions. KG LPN

## 2012-03-13 ENCOUNTER — Other Ambulatory Visit: Payer: Self-pay | Admitting: Family Medicine

## 2012-03-16 ENCOUNTER — Ambulatory Visit (INDEPENDENT_AMBULATORY_CARE_PROVIDER_SITE_OTHER): Payer: BC Managed Care – PPO | Admitting: Family Medicine

## 2012-03-16 VITALS — BP 134/84 | HR 59 | Ht 74.0 in | Wt 280.0 lb

## 2012-03-16 DIAGNOSIS — I10 Essential (primary) hypertension: Secondary | ICD-10-CM

## 2012-03-16 MED ORDER — TESTOSTERONE 20.25 MG/1.25GM (1.62%) TD GEL: 2.0000 | TRANSDERMAL | Status: DC

## 2012-03-16 NOTE — Progress Notes (Signed)
Patient ID: LEVERT HESLOP, male   DOB: 1970/08/25, 41 y.o.   MRN: [id number]  Pt denies chest pain, SOB, dizziness, or heart palpitations.  Off of BP meds as directed w/o problems.  5 min spent with pt.   HTN- Still well controlled off of lisinopril. Continue current regimen and followup with me in 4 months. Nani Gasser, MD

## 2012-04-05 ENCOUNTER — Other Ambulatory Visit: Payer: Self-pay | Admitting: *Deleted

## 2012-04-05 NOTE — Telephone Encounter (Signed)
New rx entered. He needs ot have repeat labs on the 4 pumps soon.  Lab can be check 2-8 hours after application

## 2012-04-05 NOTE — Telephone Encounter (Signed)
Pt calls and states since you increased his pumps to 2 a day on the Androgel he is running out. Needs refill but needs the quantity amount increased so it will last him a month. Fax to [facility] in [place]

## 2012-04-06 MED ORDER — TESTOSTERONE 20.25 MG/1.25GM (1.62%) TD GEL: 4.0000 | TRANSDERMAL | Status: DC

## 2012-04-09 ENCOUNTER — Telehealth: Payer: Self-pay | Admitting: *Deleted

## 2012-04-09 DIAGNOSIS — I10 Essential (primary) hypertension: Secondary | ICD-10-CM

## 2012-04-09 DIAGNOSIS — E291 Testicular hypofunction: Secondary | ICD-10-CM

## 2012-04-09 DIAGNOSIS — E781 Pure hyperglyceridemia: Secondary | ICD-10-CM

## 2012-04-09 NOTE — Telephone Encounter (Signed)
Pt calls and states needs to schedule a follow-up with and would like to get labs done in advance so you can discuss at his appointment. I looked at last labs and looks like a lipid and testosterone- anything else

## 2012-04-09 NOTE — Telephone Encounter (Signed)
Labs entered and faxed to lab. Pt informed via VM. KG LPN

## 2012-04-09 NOTE — Telephone Encounter (Signed)
Cmp, lipid, psa, test. Use hypogonadism, HTN.

## 2012-04-09 NOTE — Addendum Note (Signed)
Addended by: Barry Dienes A on: 04/09/2012 01:30 PM   Modules accepted: Orders

## 2012-04-11 LAB — COMPLETE METABOLIC PANEL WITH GFR
ALT: 24 U/L (ref 0–53)
AST: 19 U/L (ref 0–37)
Alkaline Phosphatase: 47 U/L (ref 39–117)
Calcium: 9.5 mg/dL (ref 8.4–10.5)
Chloride: 106 mEq/L (ref 96–112)
Creat: 1.07 mg/dL (ref 0.50–1.35)

## 2012-04-11 LAB — CHOLESTEROL, TOTAL: Cholesterol: 241 mg/dL — ABNORMAL HIGH (ref 0–200)

## 2012-04-14 LAB — TESTOSTERONE: Testosterone: 230 ng/dL — ABNORMAL LOW (ref 241–827)

## 2012-04-16 ENCOUNTER — Ambulatory Visit (INDEPENDENT_AMBULATORY_CARE_PROVIDER_SITE_OTHER): Payer: BC Managed Care – PPO | Admitting: Family Medicine

## 2012-04-16 ENCOUNTER — Encounter: Payer: Self-pay | Admitting: Family Medicine

## 2012-04-16 VITALS — BP 149/95 | HR 55 | Ht 74.0 in | Wt 273.0 lb

## 2012-04-16 DIAGNOSIS — IMO0001 Reserved for inherently not codable concepts without codable children: Secondary | ICD-10-CM

## 2012-04-16 DIAGNOSIS — E785 Hyperlipidemia, unspecified: Secondary | ICD-10-CM

## 2012-04-16 DIAGNOSIS — E781 Pure hyperglyceridemia: Secondary | ICD-10-CM

## 2012-04-16 DIAGNOSIS — R972 Elevated prostate specific antigen [PSA]: Secondary | ICD-10-CM

## 2012-04-16 DIAGNOSIS — E291 Testicular hypofunction: Secondary | ICD-10-CM

## 2012-04-16 NOTE — Progress Notes (Signed)
  Subjective:    Patient ID: Adam Santos, male    DOB: 06/30/1970, 41 y.o.   MRN: [id number]  HPI Here to go over bloodwork.  Of note he did not apply the AndroGel the day he went for blood work. He continues to work out regularly and is continuing to work on losing weight. He also has some questions about what goal weight would be best for him.  Given copy of labs to take home.    Review of Systems     Objective:   Physical Exam  Constitutional: He is oriented to person, place, and time. He appears well-developed and well-nourished.  HENT:  Head: Normocephalic and atraumatic.  Musculoskeletal: He exhibits no edema.  Neurological: He is alert and oriented to person, place, and time.  Skin: Skin is warm and dry.  Psychiatric: He has a normal mood and affect. His behavior is normal.          Assessment & Plan:  Elevation of PSA-his actual numbers well under 4 but has been gradually increasing over the last 9 months. I would like to have him stop his testosterone and then recheck his PSA in 2 months. If it is continuing to elevate and I would like to refer him to urology for further evaluation.  Hypogonadism - his testosterone level was low but he did not apply the AndroGel. I explained that this would explain why it slowed. Really the levels do not hang around that long in the body. At this point time going to have him stop the AndroGel anyway because his PSA has been slowly rising.  Hypertension-normally his blood pressure does look better than this, but he was very anxious about the PSA lab results today. I r asked him to return in 1-2 weeks for nurse blood pressure check. I suspect it will be normal on repeat. Continue with exercise and diet regimen.  His liver enzymes look fantastic today. I showed him the trend were as he has lost weight his liver enzymes have come back down well into the normal and this is a great sign that his body is happier and if the inflammation in his  liver has gone down from weight loss.  Obesity-we discussed that really his goal  should be under 200 pounds, though there is also usually a weight and which ones body feels well and things like blood pressure, cholesterol etc. are well controlled.  Hyperlipidemia-socially only a total cholesterol was checked we have blood work done about a week ago. I explained that we need to recheck a total panel to look at his triglycerides as well as getting a direct LDL which sometimes can be much more accurate and a cannulated especially in someone his triglycerides have been elevated. Continue with diet and exercise changes.   Time spent 25 min, >50% spent in counseling.

## 2012-04-16 NOTE — Patient Instructions (Addendum)
Recheck BP in 1-2 weeks for nurse visit.

## 2012-05-04 ENCOUNTER — Ambulatory Visit (INDEPENDENT_AMBULATORY_CARE_PROVIDER_SITE_OTHER): Payer: BC Managed Care – PPO | Admitting: Family Medicine

## 2012-05-04 VITALS — BP 140/86 | HR 56

## 2012-05-04 DIAGNOSIS — I10 Essential (primary) hypertension: Secondary | ICD-10-CM

## 2012-05-04 LAB — LIPID PANEL
Cholesterol: 210 mg/dL — ABNORMAL HIGH (ref 0–200)
HDL: 44 mg/dL (ref >39)
Total CHOL/HDL Ratio: 4.8 Ratio
Triglycerides: 285 mg/dL — ABNORMAL HIGH (ref <150)

## 2012-05-04 LAB — LDL CHOLESTEROL, DIRECT: Direct LDL: 92 mg/dL

## 2012-05-04 NOTE — Progress Notes (Signed)
  Subjective:    Patient ID: Adam Santos, male    DOB: 04-22-1971, 41 y.o.   MRN: [id number] Pt denies CP, SOB, dizziness, or heart palpitations. taking meds as directed without problems. Denies med side effects. 5 min spent with pt.  Pt states not on any BP meds right now but does have the Lisinopril 10mg  still at home if needs to go back on these HPI    Review of Systems     Objective:   Physical Exam        Assessment & Plan:  HTN- yes, please have him restart his lisinopril 10 mg daily. Then he can followup in 8 weeks to recheck his blood pressure. He is doing a fantastic job with diet and exercise. Nani Gasser, MD

## 2012-05-04 NOTE — Progress Notes (Signed)
  Subjective:    Patient ID: Adam Santos, male    DOB: [birth year], 41 y.o.   MRN: [id number]  HPI    Review of Systems     Objective:   Physical Exam        Assessment & Plan:  Pt notified to restart Lisinopril

## 2012-05-07 ENCOUNTER — Encounter: Payer: Self-pay | Admitting: Family Medicine

## 2012-06-04 ENCOUNTER — Other Ambulatory Visit: Payer: Self-pay | Admitting: Family Medicine

## 2012-06-04 ENCOUNTER — Ambulatory Visit (INDEPENDENT_AMBULATORY_CARE_PROVIDER_SITE_OTHER): Payer: BC Managed Care – PPO | Admitting: Family Medicine

## 2012-06-04 VITALS — BP 126/78 | HR 66

## 2012-06-04 DIAGNOSIS — R972 Elevated prostate specific antigen [PSA]: Secondary | ICD-10-CM

## 2012-06-04 DIAGNOSIS — I10 Essential (primary) hypertension: Secondary | ICD-10-CM

## 2012-06-04 NOTE — Progress Notes (Signed)
  Subjective:    Patient ID: Adam Santos, male    DOB: [birth year], 42 y.o.   MRN: [id number] Nurse blood pressure check. No complaints of SOB, H/A, chest pain or dizziness. Pt taking meds as directed. 5 minutes spent with this patient  At last visit instructed pt to cut lisinopril in half.  Pt states when runs 6-7 miles, gets light-headed & wonders if needs to take the bp med once finished running. HPI    Review of Systems     Objective:   Physical Exam        Assessment & Plan:  HTN- BP looks perfect on half a tab. Continue current regimen. I think it is reasonable for him to wait until after exercise to take the medication and see if it makes a difference with feeling lightheaded while exercising. Also make sure hydrating well. When he is due for refill to let me know and we can change to lisinopril to a 10 mg tab. Nani Gasser, MD

## 2012-06-07 ENCOUNTER — Encounter: Payer: Self-pay | Admitting: Family Medicine

## 2012-06-08 ENCOUNTER — Encounter: Payer: Self-pay | Admitting: Family Medicine

## 2012-06-11 ENCOUNTER — Telehealth: Payer: Self-pay | Admitting: *Deleted

## 2012-06-11 NOTE — Telephone Encounter (Signed)
Pt sent thru my chart PSA was higher  This is higher than the last check on 11/27. I think we need to look at this again soon because it is continuing to raise. 07/08/2011 .92 01/18/2012 1.21 04/11/2012 1.36 Now 1.42

## 2012-06-11 NOTE — Telephone Encounter (Signed)
Yes, slight increase. That is why I want to check again in 6 mo instead of a year. Not high enough that Urologist would see him for it.

## 2012-06-25 ENCOUNTER — Encounter: Payer: Self-pay | Admitting: Family Medicine

## 2012-06-26 ENCOUNTER — Other Ambulatory Visit: Payer: Self-pay | Admitting: *Deleted

## 2012-06-26 MED ORDER — LISINOPRIL 10 MG PO TABS: 10.0000 mg | ORAL_TABLET | Freq: Every day | ORAL | Status: DC

## 2012-07-23 ENCOUNTER — Other Ambulatory Visit: Payer: Self-pay | Admitting: Family Medicine

## 2012-07-24 MED ORDER — LISINOPRIL 10 MG PO TABS: 10.0000 mg | ORAL_TABLET | Freq: Every day | ORAL | Status: DC

## 2012-08-23 ENCOUNTER — Other Ambulatory Visit: Payer: Self-pay | Admitting: *Deleted

## 2012-08-23 DIAGNOSIS — E781 Pure hyperglyceridemia: Secondary | ICD-10-CM

## 2012-08-23 DIAGNOSIS — E291 Testicular hypofunction: Secondary | ICD-10-CM

## 2012-08-23 MED ORDER — LISINOPRIL 10 MG PO TABS: 10.0000 mg | ORAL_TABLET | Freq: Every day | ORAL | Status: DC

## 2012-08-27 ENCOUNTER — Other Ambulatory Visit: Payer: Self-pay | Admitting: *Deleted

## 2012-08-27 DIAGNOSIS — E781 Pure hyperglyceridemia: Secondary | ICD-10-CM

## 2012-08-27 DIAGNOSIS — E291 Testicular hypofunction: Secondary | ICD-10-CM

## 2012-08-27 LAB — TESTOSTERONE: Testosterone: 270 ng/dL — ABNORMAL LOW (ref 300–890)

## 2012-08-27 LAB — LDL CHOLESTEROL, DIRECT: Direct LDL: 116 mg/dL — ABNORMAL HIGH

## 2012-08-27 LAB — PSA: PSA: 1.33 ng/mL (ref <=4.00)

## 2012-09-04 ENCOUNTER — Encounter: Payer: Self-pay | Admitting: Family Medicine

## 2012-09-04 ENCOUNTER — Ambulatory Visit (INDEPENDENT_AMBULATORY_CARE_PROVIDER_SITE_OTHER): Payer: BC Managed Care – PPO | Admitting: Family Medicine

## 2012-09-04 VITALS — BP 98/67 | HR 91 | Wt 281.0 lb

## 2012-09-04 DIAGNOSIS — I10 Essential (primary) hypertension: Secondary | ICD-10-CM

## 2012-09-04 DIAGNOSIS — IMO0001 Reserved for inherently not codable concepts without codable children: Secondary | ICD-10-CM

## 2012-09-04 DIAGNOSIS — E291 Testicular hypofunction: Secondary | ICD-10-CM

## 2012-09-04 MED ORDER — PHENTERMINE HCL 37.5 MG PO CAPS: 37.5000 mg | ORAL_CAPSULE | ORAL | Status: DC

## 2012-09-04 NOTE — Progress Notes (Signed)
Subjective:    Patient ID: Adam Santos, male    DOB: December 10, 1970, 42 y.o.   MRN: [id number]  HPI  HTN -  Pt denies chest pain, SOB, dizziness, or heart palpitations.  Taking meds as directed w/o problems.  Denies medication side effects. Marland KitchenHe is running  12-15 miles per week.    F/U on  Hypogonadism - Tesosterone was better.  No ED or libido issues. Having some hot flashes at night.  Energy level is pretty good.  Obesity - he is a little frustrated currently because he has sort of plateaued with his weight. He would like to be at 220.  He is exercising reguarly. He is working on portion control.  Though not on diet program. He was following his calories pretty regularly but started back off this because he wasn't losing significant weight and has just been watching portion sizes at his meals.  Review of Systems     Objective:   Physical Exam  Constitutional: He is oriented to person, place, and time. He appears well-developed and well-nourished.  HENT:  Head: Normocephalic and atraumatic.  Right Ear: External ear normal.  Left Ear: External ear normal.  Nose: Nose normal.  Mouth/Throat: Oropharynx is clear and moist. No oropharyngeal exudate.  TMs and canals are clear.   Eyes: Conjunctivae and EOM are normal. Pupils are equal, round, and reactive to light.  Neck: Neck supple. No thyromegaly present.  Cardiovascular: Normal rate, regular rhythm and normal heart sounds.   Pulmonary/Chest: Effort normal and breath sounds normal.  Lymphadenopathy:    He has no cervical adenopathy.  Neurological: He is alert and oriented to person, place, and time.  Skin: Skin is warm and dry.  Psychiatric: He has a normal mood and affect. His behavior is normal.          Assessment & Plan:  HTN - WEll controlled. In fact his blood pressures little bit low. We discussed the option of stopping the medication. He is a little concerned because only stopped it previously his blood pressure jumped back  up but he is really continuing to work on his weight and has done a great job. He's also exercise regularly. He has also more recently noticed a dry tickling cough in the back of his throat. This certainly could be from the ACE inhibitor. This will stop his blood pressure pill and recheck his blood pressure with a nurse visit in one month.  F/u on hypogonadism - we discussed the option of not treating versus treating. Interestingly his PSA did climb slowly when he was on testosterone replacement therapy and he still never really reached a therapeutic level with his testosterone even though he was up to 4 pumps on his AndroGel. His PSA has come back down and his testosterone actually the best it has ever looked even though it is technically still subtherapeutic with his last set of blood work. I offered to refer him to an endocrinologist for further evaluation if he would like. He was also wondering if this could be related to his pituitary gland is really not having any other major symptoms except for maybe some hot flashes at night but even that is inconsistent. He says at this time he will hold off. I did let him know about some potential new information showing some possible increased risk of heart disease and then on testosterone therapy but I think date is out about whether not this is still a true effect or not.  Obesity-BMI of  36.  Overall I think he is in a great job. He is doing fantastic with his exercise. Did encourage him to check out my fitness PAL which is a free smart phone application to help him track his calories a little better. We also discussed potentially using weight loss medications. We discussed the options of phentermine, Belviq, Qsymia and pros and cons of medications. Will use phentermine. Stop immediately if any chest pain short of breath or palpitations. He wanted to followup monthly for blood pressure and weight check in every third visit he will need to check in with me. If he  plateaus on the medication and no longer loses weight or he has significant side effects and he will need to stop immediately. His blood pressures currently well controlled he has no prior history of heart disease.  Time spent 25 minutes, greater than 50% of time counseling about blood pressure, hypothyroidism and weight loss.

## 2012-09-05 ENCOUNTER — Other Ambulatory Visit: Payer: Self-pay | Admitting: Family Medicine

## 2012-09-05 LAB — CHOLESTEROL, TOTAL: Cholesterol: 222 mg/dL — ABNORMAL HIGH (ref 0–200)

## 2012-09-06 LAB — LIPID PANEL: LDL Cholesterol: 124 mg/dL — ABNORMAL HIGH (ref 0–99)

## 2012-10-11 ENCOUNTER — Ambulatory Visit (INDEPENDENT_AMBULATORY_CARE_PROVIDER_SITE_OTHER): Payer: BC Managed Care – PPO | Admitting: Family Medicine

## 2012-10-11 ENCOUNTER — Other Ambulatory Visit: Payer: Self-pay | Admitting: Family Medicine

## 2012-10-11 VITALS — BP 144/89 | HR 72 | Wt 276.0 lb

## 2012-10-11 DIAGNOSIS — R635 Abnormal weight gain: Secondary | ICD-10-CM

## 2012-10-11 DIAGNOSIS — I10 Essential (primary) hypertension: Secondary | ICD-10-CM

## 2012-10-11 MED ORDER — PHENTERMINE HCL 37.5 MG PO CAPS: 37.5000 mg | ORAL_CAPSULE | ORAL | Status: DC

## 2012-10-11 NOTE — Progress Notes (Signed)
  Subjective:    Patient ID: Adam MCCLANAHAN, male    DOB: Aug 20, 1970, 42 y.o.   MRN: [id number] Weight and BP check. Needs refill on phentermine. HPI    Review of Systems     Objective:   Physical Exam        Assessment & Plan:  Abnormal weight gain-has lost 5 pounds which is fantastic. Will refill phentermine for this month. Followup in one month for blood pressure and weight check. Because his blood pressure is elevated today I would strongly encouraged him to restart his lisinopril. Then we can reevaluate when he comes back for blood pressure check in one month. Nani Gasser, MD

## 2012-10-12 ENCOUNTER — Telehealth: Payer: Self-pay

## 2012-10-12 ENCOUNTER — Ambulatory Visit: Payer: BC Managed Care – PPO

## 2012-10-12 NOTE — Telephone Encounter (Signed)
Patient advised to schedule an appointment in one month to check blood pressure.

## 2012-10-13 ENCOUNTER — Encounter: Payer: Self-pay | Admitting: Family Medicine

## 2012-11-21 ENCOUNTER — Encounter: Payer: Self-pay | Admitting: *Deleted

## 2012-11-21 ENCOUNTER — Ambulatory Visit (INDEPENDENT_AMBULATORY_CARE_PROVIDER_SITE_OTHER): Payer: BC Managed Care – PPO | Admitting: Family Medicine

## 2012-11-21 VITALS — BP 119/81 | HR 68 | Wt 272.0 lb

## 2012-11-21 DIAGNOSIS — R635 Abnormal weight gain: Secondary | ICD-10-CM

## 2012-11-21 MED ORDER — PHENTERMINE HCL 37.5 MG PO CAPS: 37.5000 mg | ORAL_CAPSULE | ORAL | Status: DC

## 2012-11-21 NOTE — Progress Notes (Signed)
  Subjective:    Patient ID: Adam Santos, male    DOB: 12/01/1970, 42 y.o.   MRN: [id number] Pt in today for 2nd weight/bp check.  Weight is 272 lbs, bp is 119/81.  Pt has no complaints of side effects. Donne Anon, CMA HPI    Review of Systems     Objective:   Physical Exam        Assessment & Plan:  Abnormal weight gain-he's lost 4 pounds the medication is tolerating it well without any side effects. He ran a half marathon which is fantastic and has been working out regularly. Okay for refill today and followup with M.D. in one month for blood pressure and weight check. Nani Gasser, MD

## 2012-12-20 ENCOUNTER — Telehealth: Payer: Self-pay | Admitting: *Deleted

## 2012-12-20 DIAGNOSIS — E785 Hyperlipidemia, unspecified: Secondary | ICD-10-CM

## 2012-12-20 NOTE — Telephone Encounter (Signed)
Labs ordered for recheck of lipids.

## 2012-12-21 LAB — LIPID PANEL
Cholesterol: 225 mg/dL — ABNORMAL HIGH (ref 0–200)
Total CHOL/HDL Ratio: 5.4 Ratio
Triglycerides: 292 mg/dL — ABNORMAL HIGH (ref <150)
VLDL: 58 mg/dL — ABNORMAL HIGH (ref 0–40)

## 2012-12-25 ENCOUNTER — Ambulatory Visit (INDEPENDENT_AMBULATORY_CARE_PROVIDER_SITE_OTHER): Payer: BC Managed Care – PPO | Admitting: Family Medicine

## 2012-12-25 ENCOUNTER — Encounter: Payer: Self-pay | Admitting: Family Medicine

## 2012-12-25 VITALS — BP 115/78 | HR 100 | Ht 72.75 in | Wt 267.0 lb

## 2012-12-25 DIAGNOSIS — I10 Essential (primary) hypertension: Secondary | ICD-10-CM

## 2012-12-25 DIAGNOSIS — R635 Abnormal weight gain: Secondary | ICD-10-CM

## 2012-12-25 DIAGNOSIS — Z23 Encounter for immunization: Secondary | ICD-10-CM

## 2012-12-25 MED ORDER — PHENTERMINE HCL 37.5 MG PO CAPS: 37.5000 mg | ORAL_CAPSULE | ORAL | Status: DC

## 2012-12-25 NOTE — Progress Notes (Signed)
  Subjective:    Patient ID: Adam Santos, male    DOB: 01-17-1971, 42 y.o.   MRN: [id number]  HPI Abnormal weight gain - Exercise is daily 3 miles per day.  He had slacked off until recently and now back to working out daily. Plans on doing a full marathon in March.  Says has really watched his portions sizes. Hasn't really changed what he is eating.   At least once a week eating pizza.  No CP, SOB or palpitations.     HTN- doing well on lower dose of lisinopril. No more dizziness.    Review of Systems     Objective:   Physical Exam  Constitutional: He is oriented to person, place, and time. He appears well-developed and well-nourished.  HENT:  Head: Normocephalic and atraumatic.  Cardiovascular: Normal rate, regular rhythm and normal heart sounds.   Pulmonary/Chest: Effort normal and breath sounds normal.  Neurological: He is alert and oriented to person, place, and time.  Skin: Skin is warm and dry.  Psychiatric: He has a normal mood and affect. His behavior is normal.          Assessment & Plan:  Abnormal weight gain-overall he is doing fantastic. He's lost 5 more pounds. Refill phentermine today. Followup in one month for blood pressure and weight check with the nurse. Continue exercise and diet regimen. Call if any problems or side effects with the medication.  Hypertension-well-controlled.  Tdap updated today.

## 2013-01-23 ENCOUNTER — Ambulatory Visit (INDEPENDENT_AMBULATORY_CARE_PROVIDER_SITE_OTHER): Payer: BC Managed Care – PPO | Admitting: Family Medicine

## 2013-01-23 ENCOUNTER — Encounter: Payer: Self-pay | Admitting: *Deleted

## 2013-01-23 VITALS — BP 107/75 | HR 97 | Wt 263.0 lb

## 2013-01-23 DIAGNOSIS — R635 Abnormal weight gain: Secondary | ICD-10-CM

## 2013-01-23 MED ORDER — PHENTERMINE HCL 37.5 MG PO CAPS: 37.5000 mg | ORAL_CAPSULE | ORAL | Status: DC

## 2013-01-23 NOTE — Progress Notes (Signed)
  Subjective:    Patient ID: Adam Santos, male    DOB: 17-Sep-1970, 42 y.o.   MRN: [id number]  HPI Abnormal weight gain-doing well on phentermine. Lost 4 more pounds. Continues to exercise and diet. Would like refill sent [facility].   Review of Systems     Objective:   Physical Exam        Assessment & Plan:  Abnormal weight gain-doing fantastic. Blood pressure looks great. Has lost 4 more pounds. Refill medication and followup for blood pressure weight check in one month.

## 2013-01-23 NOTE — Progress Notes (Signed)
Patient seen for blood pressure check and weight check. He wants his Phentermine sent to [facility].

## 2013-02-22 ENCOUNTER — Encounter: Payer: Self-pay | Admitting: *Deleted

## 2013-02-22 ENCOUNTER — Ambulatory Visit (INDEPENDENT_AMBULATORY_CARE_PROVIDER_SITE_OTHER): Payer: BC Managed Care – PPO | Admitting: Family Medicine

## 2013-02-22 VITALS — BP 123/78 | HR 85 | Wt 265.0 lb

## 2013-02-22 DIAGNOSIS — R635 Abnormal weight gain: Secondary | ICD-10-CM

## 2013-02-22 DIAGNOSIS — E291 Testicular hypofunction: Secondary | ICD-10-CM

## 2013-02-22 MED ORDER — PHENTERMINE HCL 37.5 MG PO CAPS: 37.5000 mg | ORAL_CAPSULE | ORAL | Status: DC

## 2013-02-22 NOTE — Progress Notes (Signed)
  Subjective:    Patient ID: Adam Santos, male    DOB: 1971/02/09, 42 y.o.   MRN: [id number] Pt in for weight/bp check this morning.  He is actually up 2lbs.  He states that he was on vacation last week & was unable to run all week.  Donne Anon, CMA HPI    Review of Systems     Objective:   Physical Exam        Assessment & Plan:

## 2013-02-22 NOTE — Progress Notes (Signed)
  Subjective:    Patient ID: Adam Santos, male    DOB: [birth year], 42 y.o.   MRN: [id number]  HPI Here to followup on his weight loss goals. He has been taking phentermine without any side effects. No chest pain or shortness of breath. He is to followup with physician in one month.   Review of Systems     Objective:   Physical Exam        Assessment & Plan:  Abnormal weight gain-he's actually gained 2 pounds since I last saw him. He is continuing to walk for exercise and work on his diet. Will refill for one more month. If he does not lose weight between now and then then we'll need to discontinue the medication and consider alternatives. Nani Gasser, MD

## 2013-03-19 ENCOUNTER — Other Ambulatory Visit: Payer: Self-pay | Admitting: *Deleted

## 2013-03-19 ENCOUNTER — Other Ambulatory Visit: Payer: Self-pay | Admitting: Family Medicine

## 2013-03-19 DIAGNOSIS — E781 Pure hyperglyceridemia: Secondary | ICD-10-CM

## 2013-03-19 LAB — LIPID PANEL: Total CHOL/HDL Ratio: 5.4 Ratio

## 2013-03-20 LAB — TESTOSTERONE, FREE, TOTAL, SHBG
Sex Hormone Binding: 28 nmol/L (ref 13–71)
Testosterone: 306 ng/dL (ref 300–890)

## 2013-03-20 LAB — PSA: PSA: 1.41 ng/mL (ref <=4.00)

## 2013-03-21 ENCOUNTER — Other Ambulatory Visit: Payer: Self-pay

## 2013-03-22 ENCOUNTER — Ambulatory Visit (INDEPENDENT_AMBULATORY_CARE_PROVIDER_SITE_OTHER): Payer: BC Managed Care – PPO | Admitting: Family Medicine

## 2013-03-22 ENCOUNTER — Encounter: Payer: Self-pay | Admitting: Family Medicine

## 2013-03-22 VITALS — BP 131/84 | HR 90 | Temp 98.1°F | Ht 73.75 in | Wt 274.0 lb

## 2013-03-22 DIAGNOSIS — R0609 Other forms of dyspnea: Secondary | ICD-10-CM

## 2013-03-22 DIAGNOSIS — E781 Pure hyperglyceridemia: Secondary | ICD-10-CM

## 2013-03-22 DIAGNOSIS — E291 Testicular hypofunction: Secondary | ICD-10-CM

## 2013-03-22 DIAGNOSIS — IMO0001 Reserved for inherently not codable concepts without codable children: Secondary | ICD-10-CM

## 2013-03-22 DIAGNOSIS — D239 Other benign neoplasm of skin, unspecified: Secondary | ICD-10-CM

## 2013-03-22 DIAGNOSIS — R0683 Snoring: Secondary | ICD-10-CM

## 2013-03-22 DIAGNOSIS — Z23 Encounter for immunization: Secondary | ICD-10-CM

## 2013-03-22 DIAGNOSIS — D229 Melanocytic nevi, unspecified: Secondary | ICD-10-CM

## 2013-03-22 MED ORDER — PHENTERMINE HCL 37.5 MG PO CAPS: 37.5000 mg | ORAL_CAPSULE | ORAL | Status: DC

## 2013-03-22 NOTE — Progress Notes (Signed)
  Subjective:    Patient ID: Adam Santos, male    DOB: 10/06/1970, 42 y.o.   MRN: [id number]  HPI Hyperlipidemia-Using krill oil.  Work out regularly. REgulating his diet.    Hypogonadism-Off replacement therapy.  Rechecked testosterone and it was 300.    Obesity - on phentermine for weight loss. Tolerating it well. No S.E.  Still running 5 days per week. Says weight will fluctuate 2 lbs up or day on any day. Diet hasn't changed. Still on the phentermine.    Has 3 moles that are growing in size. Wife wanted them checked out.  2 on his back on one on the right buttock check. Says the one on his buttock gets sore and painful at time.  It occ bleeds and gets itchey .   Says when he has been angry, he gets really angry.  Feels he has had a shift in mood recenlty.  Not sure if may be from increased stress.   Sleeping well.  Feels like he may have OSA.  Say has snored loudly for years and wif has said he quits breathing at time. Says has actually been much better since has lost weight. He has neve been tested. Sleep is fair. Has been better lately  Home BPs running 118/76.   Pt denies chest pain, SOB, dizziness, or heart palpitations.  Taking meds as directed w/o problems.  Denies medication side effects.     Review of Systems     Objective:   Physical Exam  Constitutional: He is oriented to person, place, and time. He appears well-developed and well-nourished.  HENT:  Head: Normocephalic and atraumatic.  Cardiovascular: Normal rate, regular rhythm and normal heart sounds.   Pulmonary/Chest: Effort normal and breath sounds normal.  Musculoskeletal:  2 larger approx 2 cm oval light brown moles on back. One 1 cm round mounded dry, cracked lesion on the right buttock cheek.   Neurological: He is alert and oriented to person, place, and time.  Skin: Skin is warm and dry.  Psychiatric: He has a normal mood and affect. His behavior is normal.          Assessment & Plan:   Hyperlipidemia-his TC and LDL looked much better, though TG were up a great deal.  Continue krill oil and exercise.  Recheck in 6-12 mo.  Hypogonadism-Test actually some better and off test replacement tx.   Obesity - Weight up from last OV. Discussed refer to bariatric clinic vs nutrition referral.  H will think about it. RF x 1. F/U i 1 mo for nurse BP and weight check. If has OSA could be contributing to his recent difficulty in losing wt.    Snoring - STOP bang score is 6. This is a positive screening for sleep apnea. Will refer for split-night sleep study.  Inc irritability, mood- Denies feeling depressed or anxious. May be stress related.   Atypical nevus - return for bx.  ONe on buttock looks like irritated seb k vs pyogenic granuloma vs squamous cell.  2 lesion on back look like cafe-au-lait spots but if chaning rec bx.

## 2013-03-29 ENCOUNTER — Ambulatory Visit (INDEPENDENT_AMBULATORY_CARE_PROVIDER_SITE_OTHER): Payer: BC Managed Care – PPO | Admitting: Family Medicine

## 2013-03-29 ENCOUNTER — Encounter: Payer: Self-pay | Admitting: Family Medicine

## 2013-03-29 VITALS — BP 132/83 | HR 73 | Wt 270.0 lb

## 2013-03-29 DIAGNOSIS — D239 Other benign neoplasm of skin, unspecified: Secondary | ICD-10-CM

## 2013-03-29 DIAGNOSIS — D229 Melanocytic nevi, unspecified: Secondary | ICD-10-CM

## 2013-03-29 NOTE — Progress Notes (Signed)
  Subjective:    Patient ID: Adam Santos, male    DOB: 1970/09/29, 42 y.o.   MRN: [id number]  HPI Here for biopsy of lesion on right buttocks cheek. Has been there for several months. He says it bleeds and cracks on his own. Fax to wake up at night and noticed that there is blood on the sheets. He says it's very itchy and gets very irritated at times especially with exercise with his clothes rub against it.   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 Versus squamous cell versus hyperkeratotic skin lesion.  Shave Biopsy Procedure Note  Pre-operative Diagnosis: Suspicious lesion  Post-operative Diagnosis: same  Locations:right buttock cheek  Indications: irriated, bleeding  Anesthesia: Lidocaine 1% without epinephrine without added sodium bicarbonate  Procedure Details  History of allergy to iodine: no  Patient informed of the risks (including bleeding and infection) and benefits of the  procedure and Verbal informed consent obtained.  The lesion and surrounding area were given a sterile prep using betadyne and draped in the usual sterile fashion. A scalpel was used to shave an area of skin approximately 1cm by 0.9cm.  Hemostasis achieved with bipolar electrodesiccation and alumuninum chloride. Antibiotic ointment and a sterile dressing applied.  The specimen was sent for pathologic examination. The patient tolerated the procedure well.  EBL: 0.5 ml  Findings: Await pathology  Condition: Stable  Complications: none.  Plan: 1. Instructed to keep the wound dry and covered for 24-48h and clean thereafter. 2. Warning signs of infection were reviewed.   3. Recommended that the patient use OTC acetaminophen as needed for pain.  4. Return in prn

## 2013-04-22 ENCOUNTER — Encounter: Payer: Self-pay | Admitting: Family Medicine

## 2013-04-22 ENCOUNTER — Ambulatory Visit (INDEPENDENT_AMBULATORY_CARE_PROVIDER_SITE_OTHER): Payer: BC Managed Care – PPO | Admitting: Family Medicine

## 2013-04-22 VITALS — BP 129/88 | HR 84 | Temp 97.1°F | Wt 275.0 lb

## 2013-04-22 DIAGNOSIS — R635 Abnormal weight gain: Secondary | ICD-10-CM

## 2013-04-22 MED ORDER — PHENTERMINE HCL 37.5 MG PO CAPS: 37.5000 mg | ORAL_CAPSULE | ORAL | Status: DC

## 2013-04-22 NOTE — Progress Notes (Signed)
   Subjective:    Patient ID: PHEONIX CLINKSCALE, male    DOB: August 21, 1970, 42 y.o.   MRN: [id number]  HPI Abnormal weight gain - tolerating phentermine well. No SE on the medication.   Ran 5k over thanksgiving. Has gained a couple of poounds since then. Has sleep study later this week.  Pleas Koch a lot for his job.     Review of Systems     Objective:   Physical Exam        Assessment & Plan:  Abnormal weight gain - Doing welll overall. Refill med. F/U in 1 mo for nurse BP and weight.  Has sleep study later this week.

## 2013-05-01 ENCOUNTER — Telehealth: Payer: Self-pay | Admitting: Family Medicine

## 2013-05-01 DIAGNOSIS — R0683 Snoring: Secondary | ICD-10-CM

## 2013-05-01 NOTE — Telephone Encounter (Signed)
Please call patient. I did receive his PSA back from [facility]. It did show a small amount of sleep apnea. No significant desaturations or arrhythmias during sleep. The apnea with mild not requiring any type of CPAP device which is reassuring. He did have a fair amount of snoring and some limb jerking. His primary issue is upper airway resistance and snoring. He had several awakenings and arousals due to the upper airway resistance. The main treatment for this includes oral appliances, weight loss and sleeping on his side. They make special pillows and snore balls to help keep him from rolling over onto his back. If he is interested in an oral appliance this is something he can actually speak to his dentist about.

## 2013-05-02 NOTE — Telephone Encounter (Signed)
Pt informed.Adam Santos Lynetta  

## 2013-06-18 ENCOUNTER — Ambulatory Visit (INDEPENDENT_AMBULATORY_CARE_PROVIDER_SITE_OTHER): Payer: BC Managed Care – PPO | Admitting: Sports Medicine

## 2013-06-18 ENCOUNTER — Encounter: Payer: Self-pay | Admitting: Sports Medicine

## 2013-06-18 VITALS — BP 135/90 | HR 67 | Wt 285.0 lb

## 2013-06-18 DIAGNOSIS — I7300 Raynaud's syndrome without gangrene: Secondary | ICD-10-CM

## 2013-06-18 DIAGNOSIS — M5412 Radiculopathy, cervical region: Secondary | ICD-10-CM

## 2013-06-18 MED ORDER — PREDNISONE 50 MG PO TABS: ORAL_TABLET | ORAL | Status: DC

## 2013-06-18 MED ORDER — AMLODIPINE BESYLATE 5 MG PO TABS: 2.5000 mg | ORAL_TABLET | Freq: Every day | ORAL | Status: DC

## 2013-06-18 NOTE — Assessment & Plan Note (Signed)
Clinically left C7 and occurs usually at mile 6. X-rays, physical therapy, steroids. Return to see me in one month, MRI if no better.

## 2013-06-18 NOTE — Assessment & Plan Note (Signed)
Symptoms are classic and only occur in cold weather. Adding amlodipine 2.5 mg, he can do his own up taper but I have advised him to monitor his blood pressure.

## 2013-06-18 NOTE — Patient Instructions (Signed)
Raynaud's Syndrome Raynaud's Syndrome is a disorder of the blood vessels in your hands and feet. It occurs when small arteries of the arms/hands or legs/feet become sensitive to cold or emotional upset. This causes the arteries to constrict, or narrow, and reduces blood flow to the area. The color in the fingers or toes changes from white to bluish to red and this is not usually painful. There may be numbness and tingling. Sores on the skin (ulcers) can form. Symptoms are usually relieved by warming. HOME CARE INSTRUCTIONS   Avoid exposure to cold. Keep your whole body warm and dry. Dress in layers. Wear mittens or gloves when handling ice or frozen food and when outdoors. Use holders for glasses or cans containing cold drinks. If possible, stay indoors during cold weather.  Limit your use of caffeine. Switch to decaffeinated coffee, tea, and soda pop. Avoid chocolate.  Avoid smoking or being around cigarette smoke. Smoke will make symptoms worse.  Wear loose fitting socks and comfortable, roomy shoes.  Avoid vibrating tools and machinery.  If possible, avoid stressful and emotional situations. Exercise, meditation and yoga may help you cope with stress. Biofeedback may be useful.  Ask your caregiver about medicine (calcium channel blockers) that may control Raynaud's phenomena. SEEK MEDICAL CARE IF:   Your discomfort becomes worse, despite conservative treatment.  You develop sores on your fingers and toes that do not heal. Document Released: 04/29/2000 Document Revised: 07/25/2011 Document Reviewed: 05/06/2008 [employer] Patient Information 2014 [employer].  

## 2013-06-18 NOTE — Progress Notes (Signed)
   Subjective:    I'm seeing this patient as a consultation for:  Dr. Madilyn Fireman  CC: Hand pain, arm pain  HPI: Hand pain: This is present bilaterally, for several months now, worse when he goes out in the cold or runs in the cold. He is training for a marathon. His fingers tend to become very painful, numb, sting, and change colors. He has never been diagnosed or treated for Raynaud syndrome.   Left arm pain: This is described as a numbness and tingling coming down the upper arm, forearm, into the third and second fingers, associated with some tingling. He also has some neck pain and tightness at night. Symptoms are moderate, persistent. It occurs predominately when running whenever he gets past 6 miles. The  Past medical history, Surgical history, Family history not pertinant except as noted below, Social history, Allergies, and medications have been entered into the medical record, reviewed, and no changes needed.   Review of Systems: No headache, visual changes, nausea, vomiting, diarrhea, constipation, dizziness, abdominal pain, skin rash, fevers, chills, night sweats, weight loss, swollen lymph nodes, body aches, joint swelling, muscle aches, chest pain, shortness of breath, mood changes, visual or auditory hallucinations.   Objective:   General: Well Developed, well nourished, and in no acute distress.  Neuro/Psych: Alert and oriented x3, extra-ocular muscles intact, able to move all 4 extremities, sensation grossly intact. Skin: Warm and dry, no rashes noted.  Respiratory: Not using accessory muscles, speaking in full sentences, trachea midline.  Cardiovascular: Pulses palpable, no extremity edema. Abdomen: Does not appear distended. Neck: Inspection unremarkable. No palpable stepoffs. Negative Spurling's maneuver. Full neck range of motion Grip strength and sensation normal in bilateral hands Strength good C4 to T1 distribution No sensory change to C4 to T1 Negative Hoffman sign  bilaterally Reflexes normal. Hands: Unremarkable to inspection, good pulses, good sensation.   Impression and Recommendations:   This case required medical decision making of moderate complexity.

## 2013-07-22 ENCOUNTER — Other Ambulatory Visit: Payer: Self-pay | Admitting: Sports Medicine

## 2013-11-08 ENCOUNTER — Other Ambulatory Visit: Payer: Self-pay | Admitting: *Deleted

## 2013-11-08 MED ORDER — LISINOPRIL 10 MG PO TABS: 10.0000 mg | ORAL_TABLET | Freq: Every day | ORAL | Status: DC

## 2014-01-03 ENCOUNTER — Other Ambulatory Visit: Payer: Self-pay | Admitting: Family Medicine

## 2014-01-15 ENCOUNTER — Ambulatory Visit: Payer: BC Managed Care – PPO | Admitting: Family Medicine

## 2014-01-16 ENCOUNTER — Encounter: Payer: Self-pay | Admitting: Family Medicine

## 2014-01-16 ENCOUNTER — Ambulatory Visit (INDEPENDENT_AMBULATORY_CARE_PROVIDER_SITE_OTHER): Payer: BC Managed Care – PPO | Admitting: Family Medicine

## 2014-01-16 VITALS — BP 145/98 | HR 57 | Wt 293.0 lb

## 2014-01-16 DIAGNOSIS — I10 Essential (primary) hypertension: Secondary | ICD-10-CM

## 2014-01-16 DIAGNOSIS — E781 Pure hyperglyceridemia: Secondary | ICD-10-CM | POA: Diagnosis not present

## 2014-01-16 DIAGNOSIS — R259 Unspecified abnormal involuntary movements: Secondary | ICD-10-CM | POA: Diagnosis not present

## 2014-01-16 DIAGNOSIS — R635 Abnormal weight gain: Secondary | ICD-10-CM | POA: Diagnosis not present

## 2014-01-16 DIAGNOSIS — R251 Tremor, unspecified: Secondary | ICD-10-CM

## 2014-01-16 MED ORDER — PHENTERMINE HCL 37.5 MG PO CAPS: 37.5000 mg | ORAL_CAPSULE | ORAL | Status: DC

## 2014-01-16 MED ORDER — LISINOPRIL 10 MG PO TABS: 10.0000 mg | ORAL_TABLET | Freq: Every day | ORAL | Status: DC

## 2014-01-16 NOTE — Progress Notes (Signed)
Subjective:    Patient ID: Adam Santos, male    DOB: August 14, 1970, 43 y.o.   MRN: [id number]  Hypertension   Hypertension- Pt denies chest pain, SOB, dizziness, or heart palpitations.  Taking meds as directed w/o problems.  Denies medication side effects.  Ran out of meds 10 days ago.  Running 115-118/74-76.    Hypertriglyceridemia- Lab Results  Component Value Date   CHOL 237* 03/19/2013   HDL 44 03/19/2013   LDLCALC Comment:   Not calculated due to Triglyceride >400. Suggest ordering Direct LDL (Unit Code: 9376338329).   Total Cholesterol/HDL Ratio:CHD Risk                        Coronary Heart Disease Risk Table                                        Men       Women          1/2 Average Risk              3.4        3.3              Average Risk              5.0        4.4           2X Average Risk              9.6        7.1           3X Average Risk             23.4       11.0 Use the calculated Patient Ratio above and the CHD Risk table  to determine the patient's CHD Risk. ATP III Classification (LDL):       < 100        mg/dL         Optimal      100 - 129     mg/dL         Near or Above Optimal      130 - 159     mg/dL         Borderline High      160 - 189     mg/dL         High       > 190        mg/dL         Very High   03/19/2013   LDLDIRECT 93 03/19/2013   TRIG 438* 03/19/2013   CHOLHDL 5.4 03/19/2013   Has gained some weight some weight even though has still been running and has been tracking caloroies with [employer].  Plans on running a half marathon in October.  Says his weight is not moving. He would like to restart his phentermine   Has noticed an occassional tremor in hands and arm. Both can doe it but usually one at a time.  Has been more stressed over the last 6 mo. No one in the family with tremor.  Usually happens when laying in bed or when relaxed and at rest.  Usually not present when he is active.  Denies any neurolgic changes. Denies any weakness or numbness in the  extremities.  Review of  Systems     Objective:   Physical Exam  Constitutional: He is oriented to person, place, and time. He appears well-developed and well-nourished.  HENT:  Head: Normocephalic and atraumatic.  Neck: Neck supple. No thyromegaly present.  Cardiovascular: Normal rate, regular rhythm and normal heart sounds.   Pulmonary/Chest: Effort normal and breath sounds normal.  Lymphadenopathy:    He has no cervical adenopathy.  Neurological: He is alert and oriented to person, place, and time. He has normal reflexes. He displays normal reflexes. No cranial nerve deficit. He exhibits normal muscle tone.  Mild intention tremor, bilat  Skin: Skin is warm and dry.  Psychiatric: He has a normal mood and affect. His behavior is normal.          Assessment & Plan:  HTN - uncontrolled today. We'll restart medications. He normally is well controlled at home numbers look fantastic. Followup in 6 months. Due for blood work including CMP and fasting lipid panel.  Intention tremor - unclear etiology at this point to do some additional blood work to rule out deficiencies. He has no family history of tremor. No known terminal exposures are significant. Certainly stress can aggravate tremor. Caffeine intake as well.  Abnormal weight gain-discussed that he would like to restart the phentermine. We'll go ahead and give her a new prescription. Followup in one month for nurse visit for blood pressure and weight check. He is on well with this in the past without any side effects her palms. He does not currently have any heart disease.

## 2014-01-17 LAB — COMPLETE METABOLIC PANEL WITH GFR
ALBUMIN: 4.7 g/dL (ref 3.5–5.2)
ALT: 31 U/L (ref 0–53)
AST: 26 U/L (ref 0–37)
Alkaline Phosphatase: 42 U/L (ref 39–117)
BILIRUBIN TOTAL: 0.8 mg/dL (ref 0.2–1.2)
BUN: 19 mg/dL (ref 6–23)
CO2: 27 meq/L (ref 19–32)
Calcium: 9.6 mg/dL (ref 8.4–10.5)
Chloride: 103 mEq/L (ref 96–112)
Creat: 0.95 mg/dL (ref 0.50–1.35)
GLUCOSE: 101 mg/dL — AB (ref 70–99)
POTASSIUM: 4.4 meq/L (ref 3.5–5.3)
Sodium: 141 mEq/L (ref 135–145)
Total Protein: 7.1 g/dL (ref 6.0–8.3)

## 2014-01-17 LAB — FOLATE: Folate: 14.1 ng/mL

## 2014-01-17 LAB — SEDIMENTATION RATE: Sed Rate: 1 mm/hr (ref 0–16)

## 2014-01-17 LAB — CBC WITH DIFFERENTIAL/PLATELET
Basophils Absolute: 0 10*3/uL (ref 0.0–0.1)
Basophils Relative: 0 % (ref 0–1)
EOS PCT: 2 % (ref 0–5)
Eosinophils Absolute: 0.1 10*3/uL (ref 0.0–0.7)
HEMATOCRIT: 42.3 % (ref 39.0–52.0)
HEMOGLOBIN: 14.7 g/dL (ref 13.0–17.0)
Lymphocytes Relative: 36 % (ref 12–46)
Lymphs Abs: 2.5 10*3/uL (ref 0.7–4.0)
MCH: 31.7 pg (ref 26.0–34.0)
MCHC: 34.8 g/dL (ref 30.0–36.0)
MCV: 91.4 fL (ref 78.0–100.0)
MONO ABS: 0.4 10*3/uL (ref 0.1–1.0)
MONOS PCT: 6 % (ref 3–12)
NEUTROS ABS: 3.9 10*3/uL (ref 1.7–7.7)
Neutrophils Relative %: 56 % (ref 43–77)
Platelets: 225 10*3/uL (ref 150–400)
RBC: 4.63 MIL/uL (ref 4.22–5.81)
RDW: 13.9 % (ref 11.5–15.5)
WBC: 7 10*3/uL (ref 4.0–10.5)

## 2014-01-17 LAB — MAGNESIUM: MAGNESIUM: 2 mg/dL (ref 1.5–2.5)

## 2014-01-17 LAB — TSH: TSH: 1.485 u[IU]/mL (ref 0.350–4.500)

## 2014-01-17 LAB — LIPID PANEL
Cholesterol: 219 mg/dL — ABNORMAL HIGH (ref 0–200)
HDL: 50 mg/dL (ref >39)
LDL Cholesterol: 119 mg/dL — ABNORMAL HIGH (ref 0–99)
Total CHOL/HDL Ratio: 4.4 Ratio
Triglycerides: 250 mg/dL — ABNORMAL HIGH (ref <150)
VLDL: 50 mg/dL — ABNORMAL HIGH (ref 0–40)

## 2014-01-17 LAB — VITAMIN B12: VITAMIN B 12: 548 pg/mL (ref 211–911)

## 2014-01-17 LAB — FERRITIN: FERRITIN: 202 ng/mL (ref 22–322)

## 2014-01-17 LAB — VITAMIN D 25 HYDROXY (VIT D DEFICIENCY, FRACTURES): Vit D, 25-Hydroxy: 45 ng/mL (ref 30–89)

## 2014-06-24 IMAGING — CR DG ELBOW COMPLETE 3+V*R*
4 series · 4 of 4 positions shown · non-contrast
Comparison: None.

CLINICAL DATA: Right elbow pain.

RIGHT ELBOW - COMPLETE 3+ VIEW

[view not recorded (1 of 4)]
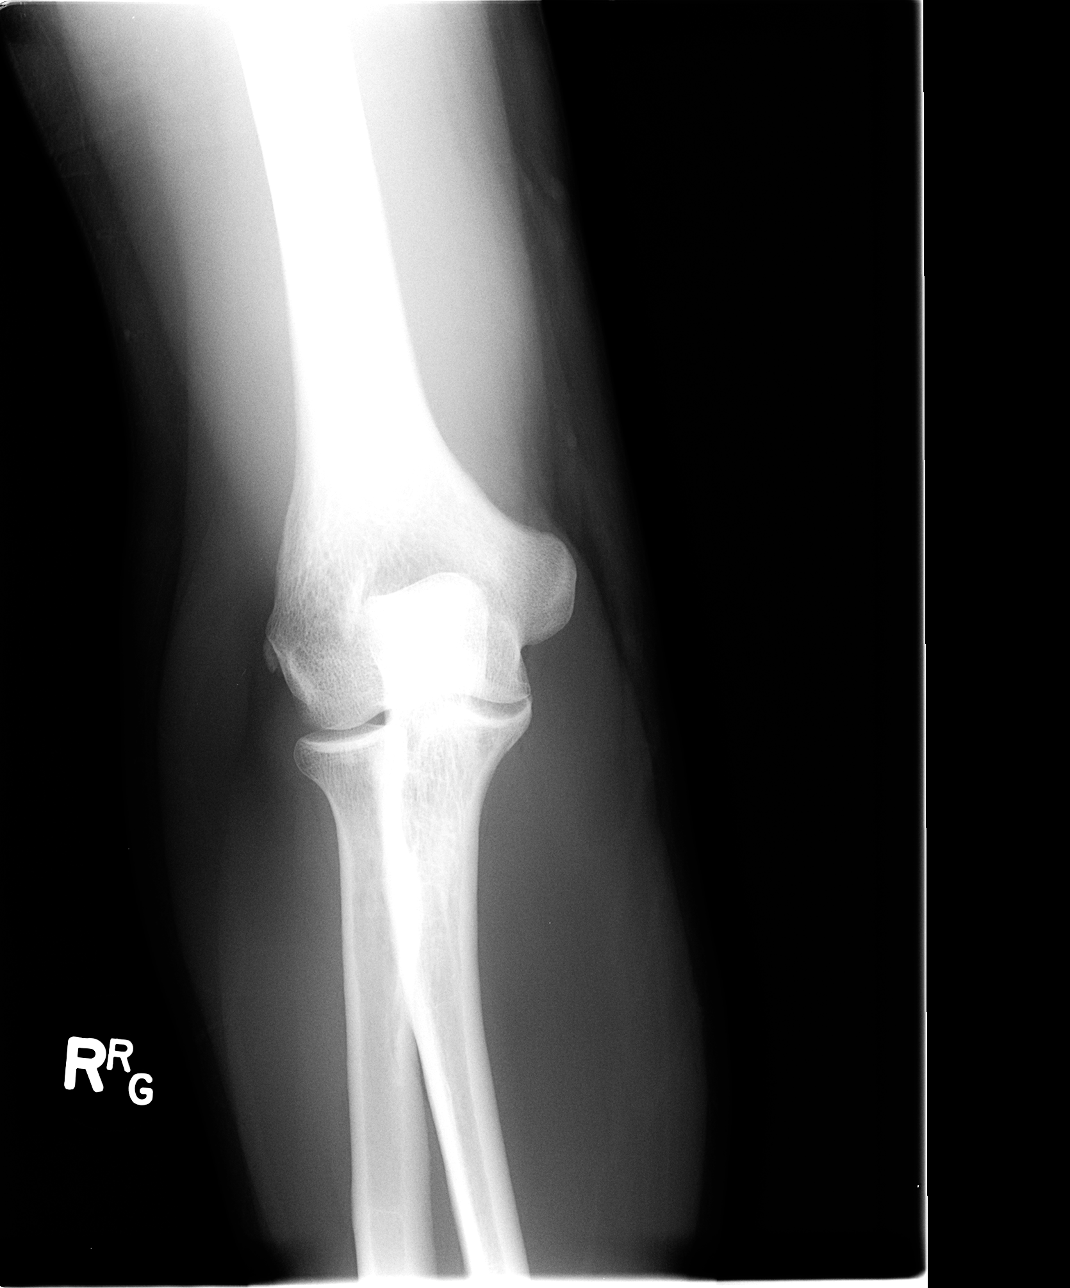

[view not recorded (2 of 4)]
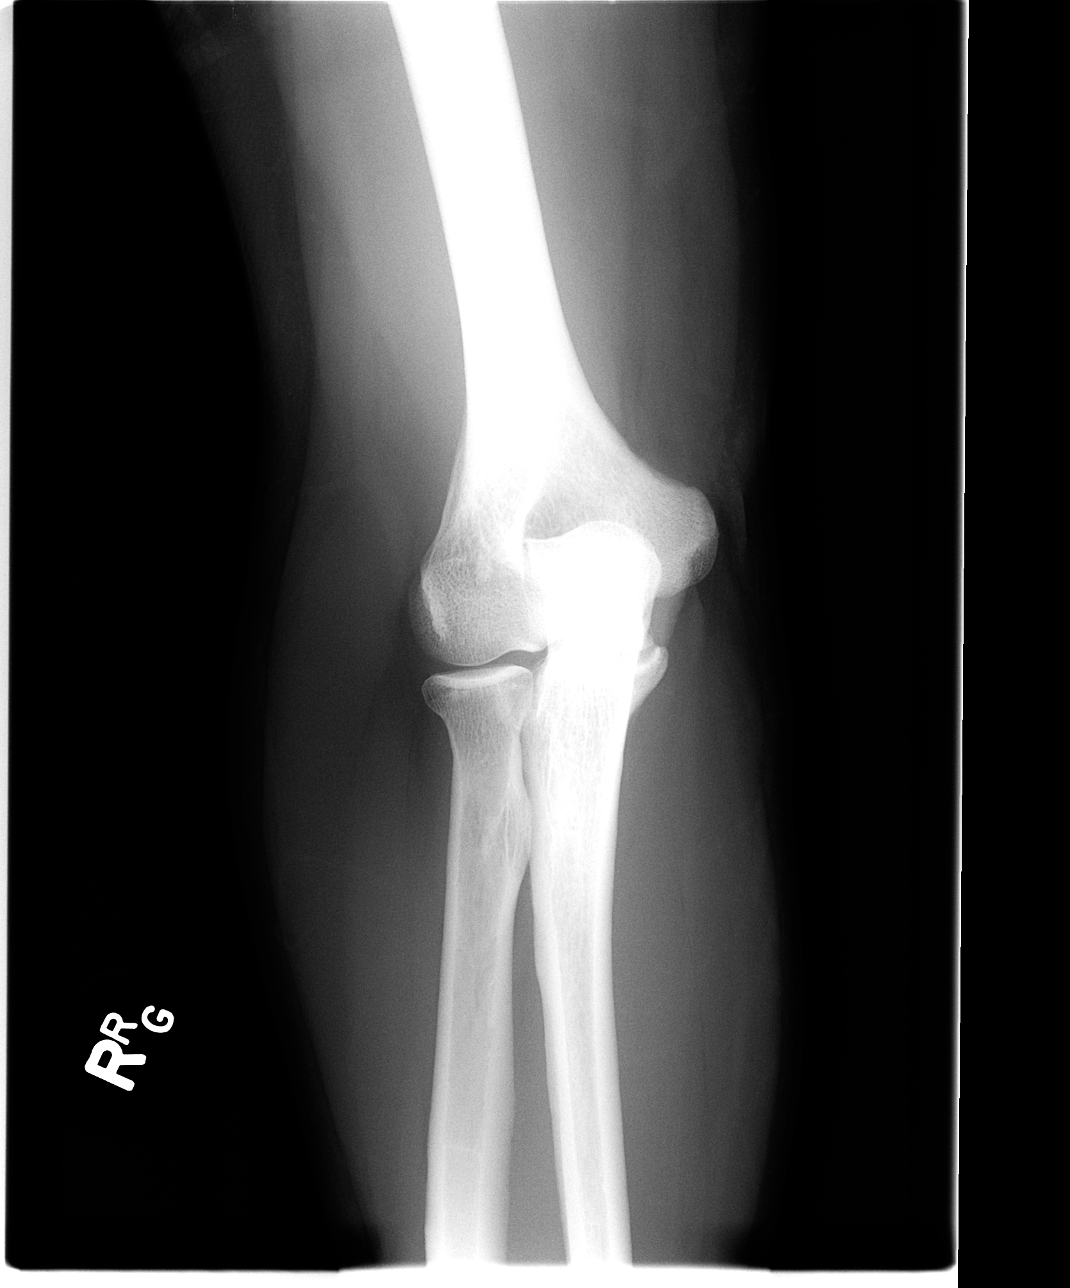

[view not recorded (3 of 4)]
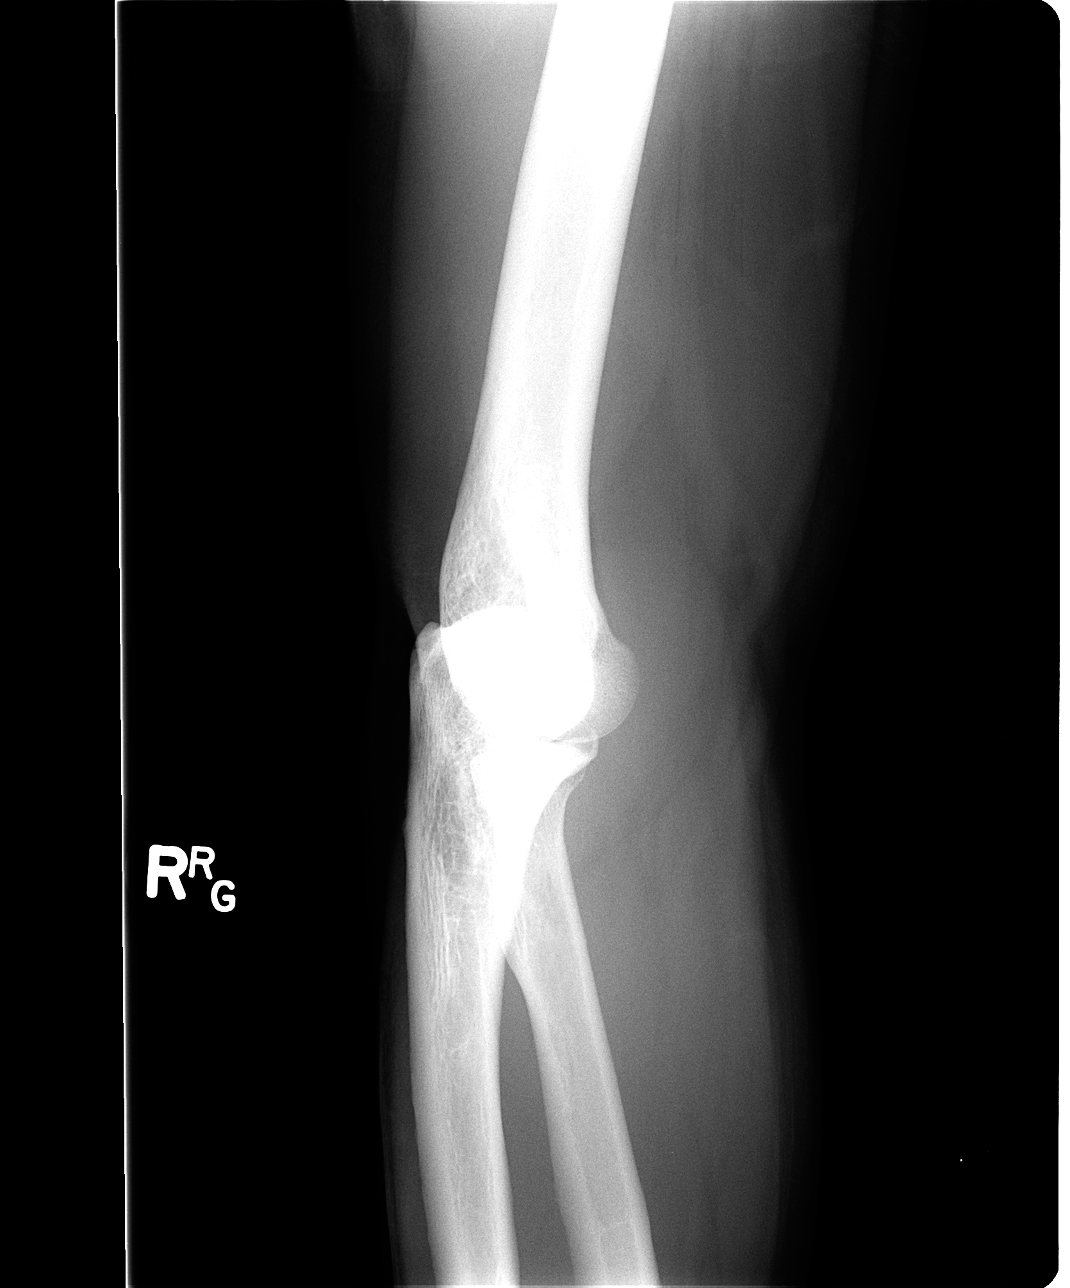

[view not recorded (4 of 4)]
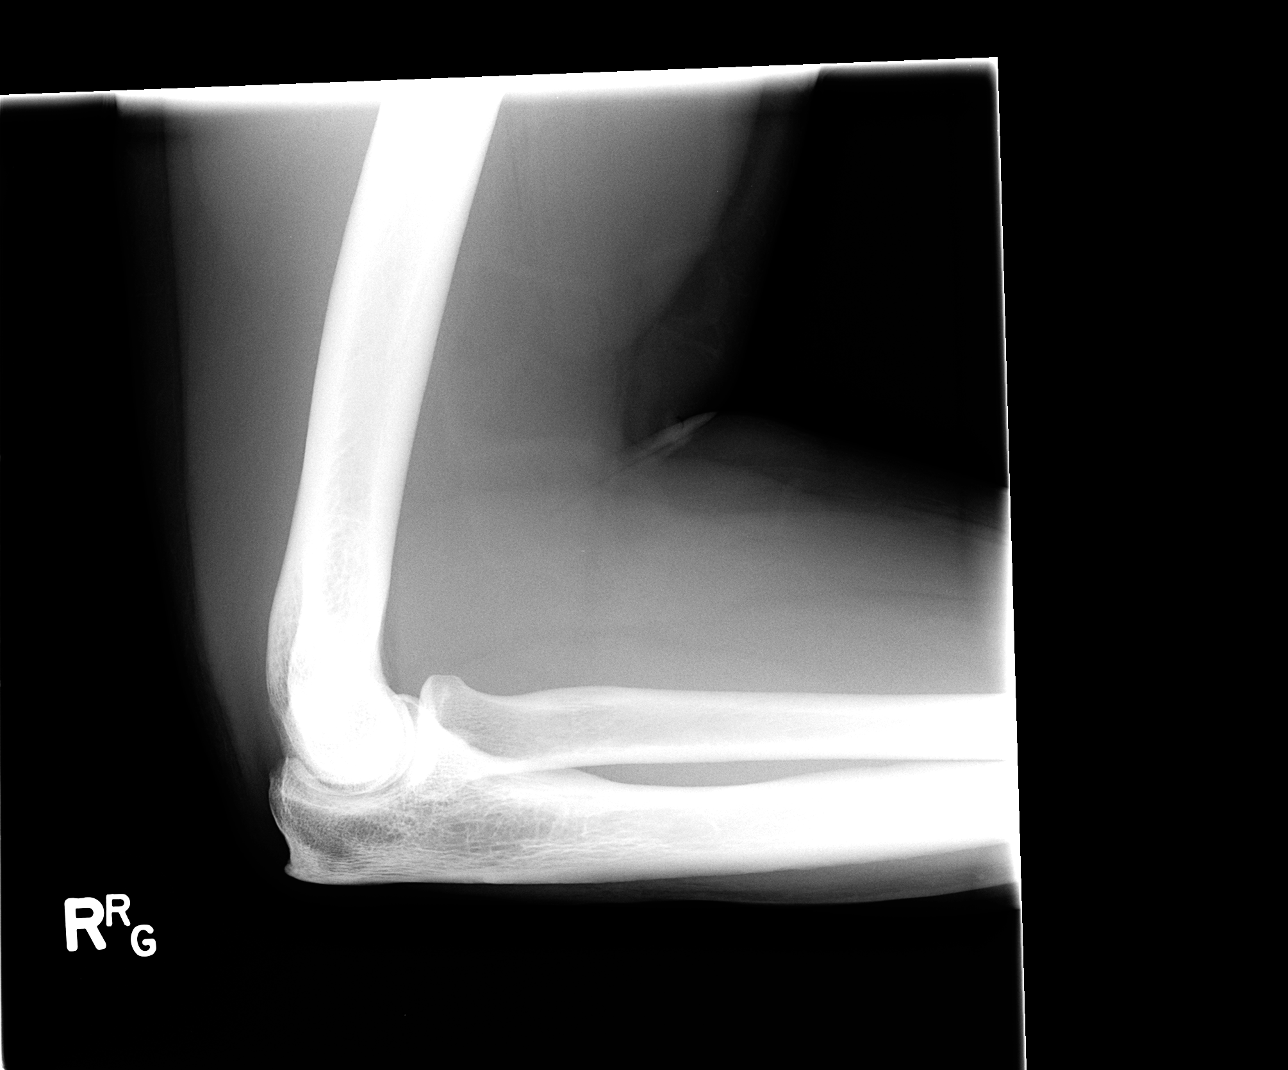

[4 of 4 positions shown; findings below may reference images not displayed]

FINDINGS: No joint effusion or fracture. Very minimal degenerative
change in the right elbow.
IMPRESSION: No joint effusion or fracture.  Very minimal degenerative change in
the right elbow.

## 2014-07-17 ENCOUNTER — Ambulatory Visit: Payer: BC Managed Care – PPO | Admitting: Family Medicine

## 2014-08-18 ENCOUNTER — Telehealth: Payer: Self-pay | Admitting: Family Medicine

## 2014-08-18 ENCOUNTER — Other Ambulatory Visit: Payer: Self-pay | Admitting: *Deleted

## 2014-08-18 DIAGNOSIS — I10 Essential (primary) hypertension: Secondary | ICD-10-CM

## 2014-08-18 MED ORDER — LISINOPRIL 10 MG PO TABS: 10.0000 mg | ORAL_TABLET | Freq: Every day | ORAL | Status: DC

## 2014-08-18 NOTE — Telephone Encounter (Signed)
Adam Santos can be reached on [phone].

## 2014-08-18 NOTE — Telephone Encounter (Signed)
#  15 of lisinopril sent to pharm to get pt through until his appt on the 12th.  Pt notified.

## 2014-08-18 NOTE — Telephone Encounter (Signed)
Pt called. He has scheduled an appt  to see Dr Madilyn Fireman on 4/12 but he will be out of bp meds by then.

## 2014-08-26 ENCOUNTER — Encounter: Payer: Self-pay | Admitting: Family Medicine

## 2014-08-26 ENCOUNTER — Ambulatory Visit (INDEPENDENT_AMBULATORY_CARE_PROVIDER_SITE_OTHER): Payer: BLUE CROSS/BLUE SHIELD | Admitting: Family Medicine

## 2014-08-26 VITALS — BP 138/85 | HR 72 | Ht 74.0 in | Wt 320.0 lb

## 2014-08-26 DIAGNOSIS — M5412 Radiculopathy, cervical region: Secondary | ICD-10-CM

## 2014-08-26 DIAGNOSIS — M2540 Effusion, unspecified joint: Secondary | ICD-10-CM

## 2014-08-26 DIAGNOSIS — I10 Essential (primary) hypertension: Secondary | ICD-10-CM

## 2014-08-26 LAB — BASIC METABOLIC PANEL WITH GFR
BUN: 17 mg/dL (ref 6–23)
CHLORIDE: 104 meq/L (ref 96–112)
CO2: 26 meq/L (ref 19–32)
Calcium: 9.4 mg/dL (ref 8.4–10.5)
Creat: 0.91 mg/dL (ref 0.50–1.35)
GFR, Est African American: >89 mL/min
Glucose, Bld: 101 mg/dL — ABNORMAL HIGH (ref 70–99)
POTASSIUM: 4.5 meq/L (ref 3.5–5.3)
SODIUM: 140 meq/L (ref 135–145)

## 2014-08-26 LAB — URIC ACID: URIC ACID, SERUM: 7.8 mg/dL (ref 4.0–7.8)

## 2014-08-26 MED ORDER — LISINOPRIL 10 MG PO TABS: 10.0000 mg | ORAL_TABLET | Freq: Every day | ORAL | Status: DC

## 2014-08-26 NOTE — Patient Instructions (Signed)

## 2014-08-26 NOTE — Progress Notes (Signed)
   Subjective:    Patient ID: Adam Santos, male    DOB: 03/29/1971, 44 y.o.   MRN: [id number]  HPI Hypertension- Pt denies chest pain, SOB, dizziness, or heart palpitations.  Taking meds as directed w/o problems.  Denies medication side effects.  He has gained some weight since quit running. Plans to run 3 miles, 3 days per week.   Problems with cervical radiculitis. He says unfortunately this is what caused him to quit running. Into his run he would start to get pain and tingling down his left arm from his neck. It would usually happen around the 3 mile mark. He did see Dr. Dianah Field for this. He recommended some home physical therapy and anti-inflammatories. Has gotten a little better, though not resolved.  Gets pain in the right big toe and comes and goes. Can last a few days.  It swells and gets warm to the touch. Says thought had broken it.  Thinks may be gout.  Father had gout. Last flare was about a week ago.     Review of Systems     Objective:   Physical Exam  Constitutional: He is oriented to person, place, and time. He appears well-developed and well-nourished.  HENT:  Head: Normocephalic and atraumatic.  Cardiovascular: Normal rate, regular rhythm and normal heart sounds.   Pulmonary/Chest: Effort normal and breath sounds normal.  Neurological: He is alert and oriented to person, place, and time.  Skin: Skin is warm and dry.  Psychiatric: He has a normal mood and affect. His behavior is normal.          Assessment & Plan:  HTN - well controlled but borderline.  Work on diet and weight loss.  F/U in 6 months.   Joint swelling- possible gout.  Can use NSAIDs for tx if occurs again. Dicussed dietary measures.  H.O provided. Will check uric acid.  Discussed underlying etiology. Consider treatment with allopurinol if we confirm diagnosis.  Cervical radiculitis-encouraged him to follow back up with Dr. Dianah Field if it is not completely improved. Or consider formal  physical therapy. I did encourage him to get back into his regular exercise routine

## 2015-03-10 ENCOUNTER — Ambulatory Visit (INDEPENDENT_AMBULATORY_CARE_PROVIDER_SITE_OTHER): Payer: Managed Care, Other (non HMO) | Admitting: Family Medicine

## 2015-03-10 ENCOUNTER — Encounter: Payer: Self-pay | Admitting: Family Medicine

## 2015-03-10 VITALS — BP 141/93 | HR 76 | Temp 98.4°F | Wt 325.0 lb

## 2015-03-10 DIAGNOSIS — H109 Unspecified conjunctivitis: Secondary | ICD-10-CM | POA: Insufficient documentation

## 2015-03-10 MED ORDER — POLYMYXIN B-TRIMETHOPRIM 10000-0.1 UNIT/ML-% OP SOLN: 2.0000 [drp] | OPHTHALMIC | Status: DC

## 2015-03-10 NOTE — Patient Instructions (Signed)
Thank you for coming in today. Use over-the-counter Zaditor eyedrops (Ketotifen) Use over-the-counter Zyrtec (cetirizine)  Use Systane artificial tears as needed  Use the antibiotic prescription drops if not better.   Allergic Conjunctivitis Allergic conjunctivitis is inflammation of the clear membrane that covers the white part of your eye and the inner surface of your eyelid (conjunctiva), and it is caused by allergies. The blood vessels in the conjunctiva become inflamed, and this causes the eye to become red or pink, and it often causes itchiness in the eye. Allergic conjunctivitis cannot be spread by one person to another person (noncontagious). CAUSES This condition is caused by an allergic reaction. Common causes of an allergic reaction (allergens) include:  Dust.  Pollen.  Mold.  Animal dander or secretions. RISK FACTORS This condition is more likely to develop if you are exposed to high levels of allergens that cause the allergic reaction. This might include being outdoors when air pollen levels are high or being around animals that you are allergic to. SYMPTOMS Symptoms of this condition may include:  Eye redness.  Tearing of the eyes.  Watery eyes.  Itchy eyes.  Burning feeling in the eyes.  Clear drainage from the eyes.  Swollen eyelids. DIAGNOSIS This condition may be diagnosed by medical history and physical exam. If you have drainage from your eyes, it may be tested to rule out other causes of conjunctivitis. TREATMENT Treatment for this condition often includes medicines. These may be eye drops, ointments, or oral medicines. They may be prescription medicines or over-the-counter medicines. HOME CARE INSTRUCTIONS  Take or apply medicines only as directed by your health care provider.  Do not touch or rub your eyes.  Do not wear contact lenses until the inflammation is gone. Wear glasses instead.  Do not wear eye makeup until the inflammation is  gone.  Apply a cool, clean washcloth to your eye for 10-20 minutes, 3-4 times a day.  Try to avoid whatever allergen is causing the allergic reaction. SEEK MEDICAL CARE IF:  Your symptoms get worse.  You have pus draining from your eye.  You have new symptoms.  You have a fever.   This information is not intended to replace advice given to you by your health care provider. Make sure you discuss any questions you have with your health care provider.   Document Released: 07/23/2002 Document Revised: 05/23/2014 Document Reviewed: 02/11/2014 Elsevier Interactive Patient Education 2016 [employer].   Scleritis and Episcleritis The outer part of the eyeball is covered with a tough fibrous covering called the sclera. It is the white part of the eye. This tough covering also has a thin membrane lying on top of it called the episclera.   When the sclera becomes red and sore (inflamed), it is called scleritis.  When the episclera becomes inflamed, it is called episcleritis. CAUSES   Scleritis is usually more severe and is associated with autoimmune diseases such as:  Rheumatoid arthritis.  Inflammations of the bowel such as Crohn's Disease (regional enteritis).  Ulcerative colitis.  Episcleritis usually has no known cause. SYMPTOMS  Both scleritis and episcleritis cause red patches or a nodule on the eye. DIAGNOSIS  This condition should be examined by an ophthalmologist. This is because very strong medications that have side effects to the body and eye may be required to treat severe attacks. Further investigations into the patient's general health may be necessary. TREATMENT   Episcleritis tends to get better without treatment within a week or two.  Scleritis is more severe. Often, your caregiver will prescribe steroids by mouth (orally) or as drops in the eye. This treatment helps lessen the redness and soreness (inflammation). HOME CARE INSTRUCTIONS   Take all medications  as directed.  Keep your follow-up appointments as directed.  Avoid irritation of the involved eye(s).  Stop using hard or soft contact lenses until your caregiver tells you that it is safe to use them. SEEK MEDICAL CARE IF:   Redness or irritation gets worse.  You develop pain or sensitivity to light.  You develop any change in vision in the involved eye(s).   This information is not intended to replace advice given to you by your health care provider. Make sure you discuss any questions you have with your health care provider.   Document Released: 04/26/2001 Document Revised: 07/25/2011 Document Reviewed: 08/28/2008 Elsevier Interactive Patient Education [employer].

## 2015-03-10 NOTE — Progress Notes (Signed)
Adam Santos is a 44 y.o. male who presents to [facility]  today for eye swelling. Patient has a one-week history of first right than left eyeball swelling. This is associated with redness itching and discharge. The right eye has resolved spontaneously but the left eye still bothersome. He denies any pain or blurry vision. He has tried some saline eyedrops but no other treatment. He notes a mild runny nose. No fevers chills nausea vomiting or diarrhea. No history of allergies.   Past Medical History  Diagnosis Date  . Hyperlipidemia   . Undescended testicle    Past Surgical History  Procedure Laterality Date  . Tonsills removed    . Testicle surgery     Social History  Substance Use Topics  . Smoking status: Never Smoker   . Smokeless tobacco: Not on file  . Alcohol Use: Yes   family history includes Heart attack in his father; Hypertension in his father.  ROS as above Medications: Current Outpatient Prescriptions  Medication Sig Dispense Refill  . lisinopril (PRINIVIL,ZESTRIL) 10 MG tablet Take 1 tablet (10 mg total) by mouth daily. 180 tablet 1  . trimethoprim-polymyxin b (POLYTRIM) ophthalmic solution Place 2 drops into the left eye every 4 (four) hours. 10 mL 0   No current facility-administered medications for this visit.   No Known Allergies   Exam:  BP 141/93 mmHg  Pulse 76  Temp(Src) 98.4 F (36.9 C) (Oral)  Wt 325 lb (147.419 kg) Gen: Well NAD HEENT: EOMI,  MMM right eye is normal. Left eye conjunctiva injection slightly swollen scleritis laterally. PERRLA laterally. Pain-free. Lungs: Normal work of breathing. CTABL Heart: RRR no MRG Abd: NABS, Soft. Nondistended, Nontender Exts: Brisk capillary refill, warm and well perfused.   No results found for this or any previous visit (from the past 24 hour(s)). No results found.   Please see individual assessment and plan sections.

## 2015-03-10 NOTE — Assessment & Plan Note (Signed)
Symptoms likely related to allergic conjunctivitis. Plan for Zaditor and Zyrtec. Additionally use Polytrim eyedrops if not better. Follow-up with ophthalmology if worsening or not improving.

## 2015-05-06 ENCOUNTER — Encounter: Payer: Self-pay | Admitting: Family Medicine

## 2015-05-06 ENCOUNTER — Ambulatory Visit (INDEPENDENT_AMBULATORY_CARE_PROVIDER_SITE_OTHER): Payer: Managed Care, Other (non HMO) | Admitting: Family Medicine

## 2015-05-06 ENCOUNTER — Ambulatory Visit (INDEPENDENT_AMBULATORY_CARE_PROVIDER_SITE_OTHER): Payer: Managed Care, Other (non HMO)

## 2015-05-06 VITALS — BP 150/109 | HR 70 | Temp 98.4°F | Wt 338.0 lb

## 2015-05-06 DIAGNOSIS — Z23 Encounter for immunization: Secondary | ICD-10-CM | POA: Diagnosis not present

## 2015-05-06 DIAGNOSIS — M7662 Achilles tendinitis, left leg: Secondary | ICD-10-CM

## 2015-05-06 DIAGNOSIS — M7732 Calcaneal spur, left foot: Secondary | ICD-10-CM

## 2015-05-06 MED ORDER — NITROGLYCERIN 0.2 MG/HR TD PT24: MEDICATED_PATCH | TRANSDERMAL | Status: DC

## 2015-05-06 NOTE — Assessment & Plan Note (Signed)
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 Treat with eccentric exercises nitroglycerin patches Heel lifts and recheck in 4 weeks

## 2015-05-06 NOTE — Patient Instructions (Signed)
Thank you for coming in today. Do the eccentric heel exercises 30 reps 2-3 x daily.  Use the heel lifts as needed.  Use the nitro patches.   Nitroglycerin Protocol   Apply 1/4 nitroglycerin patch to affected area daily.  Change position of patch within the affected area every 24 hours.  You may experience a headache during the first 1-2 weeks of using the patch, these should subside.  If you experience headaches after beginning nitroglycerin patch treatment, you may take your preferred over the counter pain reliever.  Another side effect of the nitroglycerin patch is skin irritation or rash related to patch adhesive.  Please notify our office if you develop more severe headaches or rash, and stop the patch.  Tendon healing with nitroglycerin patch may require 12 to 24 weeks depending on the extent of injury.  Men should not use if taking Viagra, Cialis, or Levitra.   Do not use if you have migraines or rosacea.    Return in 4 weeks or so.   Achilles Tendinitis Achilles tendinitis is inflammation of the tough, cord-like band that attaches the lower muscles of your leg to your heel (Achilles tendon). It is usually caused by overusing the tendon and joint involved.  CAUSES Achilles tendinitis can happen because of:  A sudden increase in exercise or activity (such as running).  Doing the same exercises or activities (such as jumping) over and over.  Not warming up calf muscles before exercising.  Exercising in shoes that are worn out or not made for exercise.  Having arthritis or a bone growth on the back of the heel bone. This can rub against the tendon and hurt the tendon. SIGNS AND SYMPTOMS The most common symptoms are:  Pain in the back of the leg, just above the heel. The pain usually gets worse with exercise and better with rest.  Stiffness or soreness in the back of the leg, especially in the morning.  Swelling of the skin over the Achilles tendon.  Trouble  standing on tiptoe. Sometimes, an Achilles tendon tears (ruptures). Symptoms of an Achilles tendon rupture can include:  Sudden, severe pain in the back of the leg.  Trouble putting weight on the foot or walking normally. DIAGNOSIS Achilles tendinitis will be diagnosed based on symptoms and a physical examination. An X-ray may be done to check if another condition is causing your symptoms. An MRI may be ordered if your health care provider suspects you may have completely torn your tendon, which is called an Achilles tendon rupture.  TREATMENT  Achilles tendinitis usually gets better over time. It can take weeks to months to heal completely. Treatment focuses on treating the symptoms and helping the injury heal. HOME CARE INSTRUCTIONS   Rest your Achilles tendon and avoid activities that cause pain.  Apply ice to the injured area:  Put ice in a plastic bag.  Place a towel between your skin and the bag.  Leave the ice on for 20 minutes, 2-3 times a day  Try to avoid using the tendon (other than gentle range of motion) while the tendon is painful. Do not resume use until instructed by your health care provider. Then begin use gradually. Do not increase use to the point of pain. If pain does develop, decrease use and continue the above measures. Gradually increase activities that do not cause discomfort until you achieve normal use.  Do exercises to make your calf muscles stronger and more flexible. Your health care provider or physical  therapist can recommend exercises for you to do.  Wrap your ankle with an elastic bandage or other wrap. This can help keep your tendon from moving too much. Your health care provider will show you how to wrap your ankle correctly.  Only take over-the-counter or prescription medicines for pain, discomfort, or fever as directed by your health care provider. SEEK MEDICAL CARE IF:   Your pain and swelling increase or pain is uncontrolled with  medicines.  You develop new, unexplained symptoms or your symptoms get worse.  You are unable to move your toes or foot.  You develop warmth and swelling in your foot.  You have an unexplained temperature. MAKE SURE YOU:   Understand these instructions.  Will watch your condition.  Will get help right away if you are not doing well or get worse.   This information is not intended to replace advice given to you by your health care provider. Make sure you discuss any questions you have with your health care provider.   Document Released: 02/09/2005 Document Revised: 05/23/2014 Document Reviewed: 12/12/2012 Elsevier Interactive Patient Education [employer].

## 2015-05-06 NOTE — Progress Notes (Signed)
   Subjective:    I'm seeing this patient as a consultation for:  Dr Madilyn Fireman  CC: Heel Pain  HPI: She notes a 10 day history of left heel pain. He denies any injury but the pain happened suddenly. He is able to walk however with pain. No radiating pain weakness or numbness fevers or chills. He has not tried any medications yet. He denies any past history Achilles tendinitis.  Past medical history, Surgical history, Family history not pertinant except as noted below, Social history, Allergies, and medications have been entered into the medical record, reviewed, and no changes needed.   Review of Systems: No headache, visual changes, nausea, vomiting, diarrhea, constipation, dizziness, abdominal pain, skin rash, fevers, chills, night sweats, weight loss, swollen lymph nodes, body aches, joint swelling, muscle aches, chest pain, shortness of breath, mood changes, visual or auditory hallucinations.   Objective:    Filed Vitals:   05/06/15 1421  BP: 150/109  Pulse: 70  Temp: 98.4 F (36.9 C)   General: Well Developed, well nourished, and in no acute distress.  Neuro/Psych: Alert and oriented x3, extra-ocular muscles intact, able to move all 4 extremities, sensation grossly intact. Skin: Warm and dry, no rashes noted.  Respiratory: Not using accessory muscles, speaking in full sentences, trachea midline.  Cardiovascular: Pulses palpable, no extremity edema. Abdomen: Does not appear distended. MSK: Left foot normal-appearing. Tender to palpation left posterior calcaneus. Normal motion pulses capillary refill and sensation. No obvious deformity. Mildly antalgic gait.  X-ray left foot dated 09/05/2011 reviewed  X-ray left foot today preliminary review shows tiny spur formation at the posterior calcaneus compared to the 2013 x-ray. Otherwise normal. Awaiting formal radiology review    No results found for this or any previous visit (from the past 24 hour(s)). No results  found.  Impression and Recommendations:   This case required medical decision making of moderate complexity.

## 2015-05-07 NOTE — Progress Notes (Signed)
Quick Note:  Tiny heel spur otherwise normal. ______

## 2015-06-24 ENCOUNTER — Other Ambulatory Visit: Payer: Self-pay | Admitting: Family Medicine

## 2015-07-27 ENCOUNTER — Other Ambulatory Visit: Payer: Self-pay | Admitting: *Deleted

## 2015-07-27 MED ORDER — LISINOPRIL 10 MG PO TABS: ORAL_TABLET | ORAL | Status: DC

## 2015-09-21 ENCOUNTER — Ambulatory Visit (INDEPENDENT_AMBULATORY_CARE_PROVIDER_SITE_OTHER): Payer: Managed Care, Other (non HMO) | Admitting: Family Medicine

## 2015-09-21 VITALS — BP 160/118 | HR 67 | Temp 98.3°F | Wt 342.0 lb

## 2015-09-21 DIAGNOSIS — H109 Unspecified conjunctivitis: Secondary | ICD-10-CM | POA: Diagnosis not present

## 2015-09-21 MED ORDER — OLOPATADINE HCL 0.2 % OP SOLN: 1.0000 [drp] | Freq: Every day | OPHTHALMIC | Status: DC

## 2015-09-21 NOTE — Progress Notes (Signed)
       Adam Santos is a 45 y.o. male who presents to [facility] today for eye irritation. Patient has a several day history of bilateral eye irritation. He notes a foreign body sensation and itching. He denies any foreign body exposures. He is use some Polytrim antibiotic eyedrops which have not helped. He also notes some nasal congestion and discharge. No fevers or chills nausea vomiting or diarrhea. No eye pain or blurry vision.   Past Medical History  Diagnosis Date  . Hyperlipidemia   . Undescended testicle    Past Surgical History  Procedure Laterality Date  . Tonsills removed    . Testicle surgery     Social History  Substance Use Topics  . Smoking status: Never Smoker   . Smokeless tobacco: Not on file  . Alcohol Use: Yes   family history includes Heart attack in his father; Hypertension in his father.  ROS as above Medications: Current Outpatient Prescriptions  Medication Sig Dispense Refill  . lisinopril (PRINIVIL,ZESTRIL) 10 MG tablet TAKE 1 TABLET (10 MG TOTAL) BY MOUTH DAILY. 30 tablet 6  . nitroGLYCERIN (NITRODUR - DOSED IN MG/24 HR) 0.2 mg/hr patch 1/4 patch to heel daily for Achilles tendinitis 30 patch 1  . Olopatadine HCl (PATADAY) 0.2 % SOLN Apply 1 drop to eye daily. 2.5 mL 12   No current facility-administered medications for this visit.   No Known Allergies   Exam:  BP 160/118 mmHg  Pulse 67  Temp(Src) 98.3 F (36.8 C) (Oral)  Wt 342 lb (155.13 kg) Gen: Well NAD HEENT: EOMI,  MMM Conjunctiva injection present bilaterally. Lungs: Normal work of breathing. CTABL Heart: RRR no MRG Abd: NABS, Soft. Nondistended, Nontender Exts: Brisk capillary refill, warm and well perfused.   Visual acuity is 20/25 for the right eye, left eye, and bilaterally.  No results found for this or any previous visit (from the past 24 hour(s)). No results  found.   45 year old male with allergic conjunctivitis. Treat with antihistamine eyedrops, oral antihistamines, and Flonase nasal spray. Follow-up with ophthalmology if not better.

## 2015-09-21 NOTE — Patient Instructions (Signed)
Thank you for coming in today. Use pataday drop as a prescription. If you cannot get it you can use over-the-counter Zaditor drops. Use over-the-counter Zaditor eyedrops (Ketotifen) Use over-the-counter Zyrtec (cetirizine)  Use Systane artificial tears as needed   Allergic Conjunctivitis Allergic conjunctivitis is inflammation of the clear membrane that covers the white part of your eye and the inner surface of your eyelid (conjunctiva), and it is caused by allergies. The blood vessels in the conjunctiva become inflamed, and this causes the eye to become red or pink, and it often causes itchiness in the eye. Allergic conjunctivitis cannot be spread by one person to another person (noncontagious). CAUSES This condition is caused by an allergic reaction. Common causes of an allergic reaction (allergens) include:  Dust.  Pollen.  Mold.  Animal dander or secretions. RISK FACTORS This condition is more likely to develop if you are exposed to high levels of allergens that cause the allergic reaction. This might include being outdoors when air pollen levels are high or being around animals that you are allergic to. SYMPTOMS Symptoms of this condition may include:  Eye redness.  Tearing of the eyes.  Watery eyes.  Itchy eyes.  Burning feeling in the eyes.  Clear drainage from the eyes.  Swollen eyelids. DIAGNOSIS This condition may be diagnosed by medical history and physical exam. If you have drainage from your eyes, it may be tested to rule out other causes of conjunctivitis. TREATMENT Treatment for this condition often includes medicines. These may be eye drops, ointments, or oral medicines. They may be prescription medicines or over-the-counter medicines. HOME CARE INSTRUCTIONS  Take or apply medicines only as directed by your health care provider.  Do not touch or rub your eyes.  Do not wear contact lenses until the inflammation is gone. Wear glasses instead.  Do not wear  eye makeup until the inflammation is gone.  Apply a cool, clean washcloth to your eye for 10-20 minutes, 3-4 times a day.  Try to avoid whatever allergen is causing the allergic reaction. SEEK MEDICAL CARE IF:  Your symptoms get worse.  You have pus draining from your eye.  You have new symptoms.  You have a fever.   This information is not intended to replace advice given to you by your health care provider. Make sure you discuss any questions you have with your health care provider.   Document Released: 07/23/2002 Document Revised: 05/23/2014 Document Reviewed: 02/11/2014 Elsevier Interactive Patient Education [employer].

## 2016-10-21 ENCOUNTER — Encounter: Payer: Self-pay | Admitting: Family Medicine

## 2016-10-21 ENCOUNTER — Ambulatory Visit (INDEPENDENT_AMBULATORY_CARE_PROVIDER_SITE_OTHER): Payer: 59 | Admitting: Family Medicine

## 2016-10-21 VITALS — BP 158/98 | HR 86 | Ht 72.54 in | Wt 330.0 lb

## 2016-10-21 DIAGNOSIS — Z9189 Other specified personal risk factors, not elsewhere classified: Secondary | ICD-10-CM

## 2016-10-21 DIAGNOSIS — I10 Essential (primary) hypertension: Secondary | ICD-10-CM

## 2016-10-21 DIAGNOSIS — Z0000 Encounter for general adult medical examination without abnormal findings: Secondary | ICD-10-CM

## 2016-10-21 DIAGNOSIS — Z8249 Family history of ischemic heart disease and other diseases of the circulatory system: Secondary | ICD-10-CM

## 2016-10-21 DIAGNOSIS — Z6841 Body Mass Index (BMI) 40.0 and over, adult: Secondary | ICD-10-CM | POA: Diagnosis not present

## 2016-10-21 MED ORDER — LISINOPRIL 20 MG PO TABS: 20.0000 mg | ORAL_TABLET | Freq: Every day | ORAL | 1 refills | Status: DC

## 2016-10-21 NOTE — Progress Notes (Addendum)
Subjective:    Patient ID: Adam Santos, male    DOB: 11-May-1971, 46 y.o.   MRN: [id number]  HPI 45 year old male comes in today for complete physical exam. He has a history of HTN but hasn't been taking his BP meds for about 6 months.  His mother passed away recently in 05-26-2023. And then his brother who is 66 years older than him had a heart attack recently. He's very concerned and want to try to reduce his risk for heart disease. He might have a discussion about how to do that. His brother had stents in the next 2 vessel bypass. He is recently started walking for exercise again in the last couple of weeks.  He is down 12 lbs from last year.   Review of Systems   Comprehensive ROS is negative.    BP (!) 156/92   Pulse 86   Ht 6' 0.54" (1.842 m)   Wt (!) 330 lb (149.7 kg)   SpO2 99%   BMI 44.09 kg/m     No Known Allergies  Past Medical History:  Diagnosis Date  . Hyperlipidemia   . Undescended testicle     Past Surgical History:  Procedure Laterality Date  . TESTICLE SURGERY    . tonsills removed      Social History   Social History  . Marital status: Married    Spouse name: N/A  . Number of children: N/A  . Years of education: N/A   Occupational History  . Not on file.   Social History Main Topics  . Smoking status: Never Smoker  . Smokeless tobacco: Never Used  . Alcohol use Yes  . Drug use: No  . Sexual activity: Yes    Partners: Female   Other Topics Concern  . Not on file   Social History Narrative  . No narrative on file    Family History  Problem Relation Age of Onset  . Heart attack Father   . Hypertension Father     Outpatient Encounter Prescriptions as of 10/21/2016  Medication Sig  . lisinopril (PRINIVIL,ZESTRIL) 10 MG tablet TAKE 1 TABLET (10 MG TOTAL) BY MOUTH DAILY. (Patient not taking: Reported on 10/21/2016)  . [DISCONTINUED] nitroGLYCERIN (NITRODUR - DOSED IN MG/24 HR) 0.2 mg/hr patch 1/4 patch to heel daily for Achilles tendinitis   . [DISCONTINUED] Olopatadine HCl (PATADAY) 0.2 % SOLN Apply 1 drop to eye daily.   No facility-administered encounter medications on file as of 10/21/2016.         Objective:   Physical Exam  Constitutional: He is oriented to person, place, and time. He appears well-developed and well-nourished.  HENT:  Head: Normocephalic and atraumatic.  Eyes: Conjunctivae and EOM are normal.  Cardiovascular: Normal rate.   Pulmonary/Chest: Effort normal.  Neurological: He is alert and oriented to person, place, and time.  Skin: Skin is dry. No pallor.  Psychiatric: He has a normal mood and affect. His behavior is normal.  Vitals reviewed.      Assessment & Plan:  CPE Keep up a regular exercise program and make sure you are eating a healthy diet Try to eat 4 servings of dairy a day, or if you are lactose intolerant take a calcium with vitamin D daily.  Your vaccines are up to date.   Risk for heart disease-he has strong genetic factors 2 first-degree relatives, his father and older brother with heart disease. We discussed the importance of healthy diet, regular exercise and getting down to healthy  BMI. We discussed controlling blood pressure and cholesterol as best he can. In fact he may even want to consider starting a statin early. We also will do an EKG today and get him scheduled for a stress test treadmill test. Also screen for diabetes.EKG shows normal sinus rhythm, 70 bpm, no acute ST-T wave changes. Poor R-wave progression in the lateral leads.   HTN - uncontrolled. Restart Cipro. Follow-up in 2 weeks for nurse visit.  BMI 44 - Discussed the importance of healthy diet regular exercise and weight loss. He is definitely on board to get healthy again.

## 2016-10-21 NOTE — Patient Instructions (Addendum)
Keep up a regular exercise program and make sure you are eating a healthy diet Try to eat 4 servings of dairy a day, or if you are lactose intolerant take a calcium with vitamin D daily.  Your vaccines are up to date.    Health Maintenance, Male A healthy lifestyle and preventive care is important for your health and wellness. Ask your health care provider about what schedule of regular examinations is right for you. What should I know about weight and diet? Eat a Healthy Diet  Eat plenty of vegetables, fruits, whole grains, low-fat dairy products, and lean protein.  Do not eat a lot of foods high in solid fats, added sugars, or salt.  Maintain a Healthy Weight Regular exercise can help you achieve or maintain a healthy weight. You should:  Do at least 150 minutes of exercise each week. The exercise should increase your heart rate and make you sweat (moderate-intensity exercise).  Do strength-training exercises at least twice a week.  Watch Your Levels of Cholesterol and Blood Lipids  Have your blood tested for lipids and cholesterol every 5 years starting at 47 years of age. If you are at high risk for heart disease, you should start having your blood tested when you are 47 years old. You may need to have your cholesterol levels checked more often if: ? Your lipid or cholesterol levels are high. ? You are older than 47 years of age. ? You are at high risk for heart disease.  What should I know about cancer screening? Many types of cancers can be detected early and may often be prevented. Lung Cancer  You should be screened every year for lung cancer if: ? You are a current smoker who has smoked for at least 30 years. ? You are a former smoker who has quit within the past 15 years.  Talk to your health care provider about your screening options, when you should start screening, and how often you should be screened.  Colorectal Cancer  Routine colorectal cancer screening  usually begins at 47 years of age and should be repeated every 5-10 years until you are 47 years old. You may need to be screened more often if early forms of precancerous polyps or small growths are found. Your health care provider may recommend screening at an earlier age if you have risk factors for colon cancer.  Your health care provider may recommend using home test kits to check for hidden blood in the stool.  A small camera at the end of a tube can be used to examine your colon (sigmoidoscopy or colonoscopy). This checks for the earliest forms of colorectal cancer.  Prostate and Testicular Cancer  Depending on your age and overall health, your health care provider may do certain tests to screen for prostate and testicular cancer.  Talk to your health care provider about any symptoms or concerns you have about testicular or prostate cancer.  Skin Cancer  Check your skin from head to toe regularly.  Tell your health care provider about any new moles or changes in moles, especially if: ? There is a change in a mole's size, shape, or color. ? You have a mole that is larger than a pencil eraser.  Always use sunscreen. Apply sunscreen liberally and repeat throughout the day.  Protect yourself by wearing long sleeves, pants, a wide-brimmed hat, and sunglasses when outside.  What should I know about heart disease, diabetes, and high blood pressure?  If you are  32-66 years of age, have your blood pressure checked every 3-5 years. If you are 78 years of age or older, have your blood pressure checked every year. You should have your blood pressure measured twice-once when you are at a hospital or clinic, and once when you are not at a hospital or clinic. Record the average of the two measurements. To check your blood pressure when you are not at a hospital or clinic, you can use: ? An automated blood pressure machine at a pharmacy. ? A home blood pressure monitor.  Talk to your health care  provider about your target blood pressure.  If you are between 64-53 years old, ask your health care provider if you should take aspirin to prevent heart disease.  Have regular diabetes screenings by checking your fasting blood sugar level. ? If you are at a normal weight and have a low risk for diabetes, have this test once every three years after the age of 78. ? If you are overweight and have a high risk for diabetes, consider being tested at a younger age or more often.  A one-time screening for abdominal aortic aneurysm (AAA) by ultrasound is recommended for men aged 104-75 years who are current or former smokers. What should I know about preventing infection? Hepatitis B If you have a higher risk for hepatitis B, you should be screened for this virus. Talk with your health care provider to find out if you are at risk for hepatitis B infection. Hepatitis C Blood testing is recommended for:  Everyone born from [birth year] through 1966.  Anyone with known risk factors for hepatitis C.  Sexually Transmitted Diseases (STDs)  You should be screened each year for STDs including gonorrhea and chlamydia if: ? You are sexually active and are younger than 47 years of age. ? You are older than 47 years of age and your health care provider tells you that you are at risk for this type of infection. ? Your sexual activity has changed since you were last screened and you are at an increased risk for chlamydia or gonorrhea. Ask your health care provider if you are at risk.  Talk with your health care provider about whether you are at high risk of being infected with HIV. Your health care provider may recommend a prescription medicine to help prevent HIV infection.  What else can I do?  Schedule regular health, dental, and eye exams.  Stay current with your vaccines (immunizations).  Do not use any tobacco products, such as cigarettes, chewing tobacco, and e-cigarettes. If you need help quitting, ask  your health care provider.  Limit alcohol intake to no more than 2 drinks per day. One drink equals 12 ounces of beer, 5 ounces of wine, or 1 ounces of hard liquor.  Do not use street drugs.  Do not share needles.  Ask your health care provider for help if you need support or information about quitting drugs.  Tell your health care provider if you often feel depressed.  Tell your health care provider if you have ever been abused or do not feel safe at home. This information is not intended to replace advice given to you by your health care provider. Make sure you discuss any questions you have with your health care provider. Document Released: 10/29/2007 Document Revised: 12/30/2015 Document Reviewed: 02/03/2015 Elsevier Interactive Patient Education  [employer].

## 2016-10-26 NOTE — Addendum Note (Signed)
Addended by: Huel Cote on: 10/26/2016 04:57 PM   Modules accepted: Orders

## 2016-12-28 LAB — COMPLETE METABOLIC PANEL WITH GFR
ALT: 41 U/L (ref 9–46)
AST: 26 U/L (ref 10–40)
Albumin: 4.2 g/dL (ref 3.6–5.1)
Alkaline Phosphatase: 56 U/L (ref 40–115)
BILIRUBIN TOTAL: 0.7 mg/dL (ref 0.2–1.2)
BUN: 21 mg/dL (ref 7–25)
CHLORIDE: 109 mmol/L (ref 98–110)
CO2: 23 mmol/L (ref 20–32)
Calcium: 8.9 mg/dL (ref 8.6–10.3)
Creat: 1.09 mg/dL (ref 0.60–1.35)
GFR, EST NON AFRICAN AMERICAN: 80 mL/min (ref >=60)
GFR, Est African American: >89 mL/min (ref >=60)
GLUCOSE: 106 mg/dL — AB (ref 65–99)
POTASSIUM: 4.2 mmol/L (ref 3.5–5.3)
SODIUM: 143 mmol/L (ref 135–146)
TOTAL PROTEIN: 6.5 g/dL (ref 6.1–8.1)

## 2016-12-28 LAB — LIPID PANEL W/REFLEX DIRECT LDL
CHOL/HDL RATIO: 5.6 ratio — AB (ref <5.0)
Cholesterol: 218 mg/dL — ABNORMAL HIGH (ref <200)
HDL: 39 mg/dL — AB (ref >40)
LDL-CHOLESTEROL: 136 mg/dL — AB
NON-HDL CHOLESTEROL (CALC): 179 mg/dL — AB (ref <130)
Triglycerides: 280 mg/dL — ABNORMAL HIGH (ref <150)

## 2016-12-28 LAB — PSA: PSA: 0.9 ng/mL (ref <=4.0)

## 2017-05-03 ENCOUNTER — Other Ambulatory Visit: Payer: Self-pay | Admitting: Family Medicine

## 2017-05-30 ENCOUNTER — Other Ambulatory Visit: Payer: Self-pay | Admitting: Family Medicine

## 2017-06-10 ENCOUNTER — Encounter: Payer: Self-pay | Admitting: Family Medicine

## 2017-06-10 DIAGNOSIS — R7309 Other abnormal glucose: Secondary | ICD-10-CM

## 2017-06-10 DIAGNOSIS — I10 Essential (primary) hypertension: Secondary | ICD-10-CM

## 2017-06-12 MED ORDER — LISINOPRIL 20 MG PO TABS: 20.0000 mg | ORAL_TABLET | Freq: Every day | ORAL | 0 refills | Status: DC

## 2017-06-12 NOTE — Addendum Note (Signed)
Addended by: Huel Cote on: 06/12/2017 07:58 AM   Modules accepted: Orders

## 2017-06-30 ENCOUNTER — Encounter: Payer: Self-pay | Admitting: Family Medicine

## 2017-06-30 ENCOUNTER — Ambulatory Visit: Payer: 59 | Admitting: Family Medicine

## 2017-06-30 VITALS — BP 128/90 | HR 79 | Ht 73.0 in | Wt 340.0 lb

## 2017-06-30 DIAGNOSIS — R635 Abnormal weight gain: Secondary | ICD-10-CM

## 2017-06-30 DIAGNOSIS — I10 Essential (primary) hypertension: Secondary | ICD-10-CM

## 2017-06-30 DIAGNOSIS — M1A0790 Idiopathic chronic gout, unspecified ankle and foot, without tophus (tophi): Secondary | ICD-10-CM | POA: Diagnosis not present

## 2017-06-30 DIAGNOSIS — Z6841 Body Mass Index (BMI) 40.0 and over, adult: Secondary | ICD-10-CM | POA: Diagnosis not present

## 2017-06-30 DIAGNOSIS — R7309 Other abnormal glucose: Secondary | ICD-10-CM | POA: Diagnosis not present

## 2017-06-30 MED ORDER — LISINOPRIL-HYDROCHLOROTHIAZIDE 20-25 MG PO TABS: 1.0000 | ORAL_TABLET | Freq: Every day | ORAL | 1 refills | Status: DC

## 2017-06-30 NOTE — Progress Notes (Signed)
Subjective:    CC: BP acheck.    HPI:  Hypertension- Pt denies chest pain, SOB, dizziness, or heart palpitations.  Taking meds as directed w/o problems.  Denies medication side effects.    BMI  > 40  - he has gained about 10 lbs back.   He is working out of state during the week and then coming back on the weekends.  He is just really having hard time finding time to exercise.  He feels like he makes good food choices overall.  He just cannot quite figure out why he is not doing a little better on his weight based on his dietary choices.  He is been using my fitness pal saw him.  Abnormal glucose-glucose level was elevated on his labs back in August  Gout-overall he is doing well.  He actually has not had a flare since last summer.  Past medical history, Surgical history, Family history not pertinant except as noted below, Social history, Allergies, and medications have been entered into the medical record, reviewed, and corrections made.   Review of Systems: No fevers, chills, night sweats, weight loss, chest pain, or shortness of breath.   Objective:    General: Well Developed, well nourished, and in no acute distress.  Neuro: Alert and oriented x3, extra-ocular muscles intact, sensation grossly intact.  HEENT: Normocephalic, atraumatic  Skin: Warm and dry, no rashes. Cardiac: Regular rate and rhythm, no murmurs rubs or gallops, no lower extremity edema.  Respiratory: Clear to auscultation bilaterally. Not using accessory muscles, speaking in full sentences.   Impression and Recommendations:    HTN - uncontrolled.  Discussed options.  We will add HCTZ to the lisinopril.  He is due for BMP and actually went for that this morning.  Normally I would see him back sooner since I am adjusting his blood pressure medication but he works out of town so I will see him back in 6 months.  Abnormal glucose-we will check hemoglobin A1c.    Gout-we will give him a handout on low purine diet.   That he is done really well and has not had any recent flares.  Abnormal weight gain /BMI 44-we discussed some strategies around getting back on track with diet and exercise.  He is been using a recumbent bike for exercise which I think is great until he is able to lose a significant amount of weight.  Continue to work with my fitness pal to help him stop calorie goals.  We also discussed that he may want to check with his insurance to see if some of the more long-term weight loss medications are covered under his plan certainly that would be an option and I will be happy to work with him on this but he would need to come in for regular visits if we did this type of medication.

## 2017-06-30 NOTE — Patient Instructions (Addendum)

## 2017-07-01 LAB — COMPLETE METABOLIC PANEL WITH GFR
AG Ratio: 1.9 (calc) (ref 1.0–2.5)
ALBUMIN MSPROF: 4.2 g/dL (ref 3.6–5.1)
ALT: 67 U/L — AB (ref 9–46)
AST: 38 U/L (ref 10–40)
Alkaline phosphatase (APISO): 54 U/L (ref 40–115)
BUN: 16 mg/dL (ref 7–25)
CO2: 26 mmol/L (ref 20–32)
CREATININE: 1.07 mg/dL (ref 0.60–1.35)
Calcium: 9.6 mg/dL (ref 8.6–10.3)
Chloride: 108 mmol/L (ref 98–110)
GFR, Est African American: 95 mL/min/{1.73_m2} (ref >=60)
GFR, Est Non African American: 82 mL/min/{1.73_m2} (ref >=60)
GLUCOSE: 127 mg/dL — AB (ref 65–99)
Globulin: 2.2 g/dL (calc) (ref 1.9–3.7)
Potassium: 4.5 mmol/L (ref 3.5–5.3)
Sodium: 143 mmol/L (ref 135–146)
TOTAL PROTEIN: 6.4 g/dL (ref 6.1–8.1)
Total Bilirubin: 0.5 mg/dL (ref 0.2–1.2)

## 2017-07-01 LAB — HEMOGLOBIN A1C
EAG (MMOL/L): 6.6 (calc)
HEMOGLOBIN A1C: 5.8 %{Hb} — AB (ref <5.7)
Mean Plasma Glucose: 120 (calc)

## 2017-11-04 ENCOUNTER — Other Ambulatory Visit: Payer: Self-pay

## 2017-11-04 ENCOUNTER — Emergency Department (INDEPENDENT_AMBULATORY_CARE_PROVIDER_SITE_OTHER)
Admission: EM | Admit: 2017-11-04 | Discharge: 2017-11-04 | Disposition: A | Discharge status: Home / Self Care | Payer: 59 | Source: Home / Self Care | Attending: Family Medicine | Admitting: Family Medicine

## 2017-11-04 DIAGNOSIS — N3000 Acute cystitis without hematuria: Secondary | ICD-10-CM

## 2017-11-04 DIAGNOSIS — N451 Epididymitis: Secondary | ICD-10-CM

## 2017-11-04 LAB — POCT URINALYSIS DIP (MANUAL ENTRY)
BILIRUBIN UA: NEGATIVE mg/dL
Glucose, UA: NEGATIVE mg/dL
Leukocytes, UA: NEGATIVE
Nitrite, UA: NEGATIVE
PH UA: 7 (ref 5.0–8.0)
RBC UA: NEGATIVE
Spec Grav, UA: 1.02 (ref 1.010–1.025)
Urobilinogen, UA: 0.2 E.U./dL

## 2017-11-04 MED ORDER — LEVOFLOXACIN 500 MG PO TABS: 500.0000 mg | ORAL_TABLET | Freq: Every day | ORAL | 0 refills | Status: AC

## 2017-11-04 NOTE — ED Triage Notes (Signed)
Pt c/o lower abd pain since thurs. Also describes it as a pressure when he has to urinate but isn't productive when trying to relieve himself. Last night recorded a 101 temperature with chills/sweats.

## 2017-11-04 NOTE — Discharge Instructions (Addendum)
May take Ibuprofen 200mg , 4 tabs every 8 hours with food as needed for pain.  Increase fluid intake. If symptoms become significantly worse during the night or over the weekend, proceed to the local emergency room.

## 2017-11-04 NOTE — ED Provider Notes (Signed)
[facility]    CSN: [id number] Arrival date & time: 11/04/17  1132     History   Chief Complaint Chief Complaint  Patient presents with  . Abdominal Pain  . Groin Pain    HPI Adam Santos is a 47 y.o. male.   Two days ago patient developed a suprapubic pressure-like sensation.  He then developed urinary urgency, frequency and hesitancy without dysuria.  Last night he developed fever to 101.8 with chills/sweats.  He has been fatigued and has has had mild nausea without vomiting.  The lower abdominal discomfort has decreased today but he has felt soreness in his right testicle.  He denies swelling or redness of his scrotum.  No urethral discharge.  The history is provided by the patient and the spouse.  Urinary Frequency  This is a new problem. The current episode started 2 days ago. The problem occurs constantly. The problem has not changed since onset.Associated symptoms include abdominal pain. Nothing aggravates the symptoms. Nothing relieves the symptoms. He has tried nothing for the symptoms.    Past Medical History:  Diagnosis Date  . Hyperlipidemia   . Undescended testicle     Patient Active Problem List   Diagnosis Date Noted  . Left Achilles tendinitis 05/06/2015  . Raynaud's disease 06/18/2013  . Cervical radiculitis 06/18/2013  . Snoring 05/01/2013  . Hypogonadism male 02/20/2012  . Obesity, Class II, BMI 35-39.9, with comorbidity 02/27/2009  . 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 ECZEMA 12/29/2008  . HYPERTRIGLYCERIDEMIA 03/02/2007  . VISUAL CHANGES 03/02/2007    Past Surgical History:  Procedure Laterality Date  . TESTICLE SURGERY    . tonsills removed         Home Medications    Prior to Admission medications   Medication Sig Start Date End Date Taking? Authorizing Provider  levofloxacin (LEVAQUIN) 500 MG tablet Take 1 tablet (500 mg total) by mouth daily for 10 days. 11/04/17 11/14/17  Kandra Nicolas, MD    lisinopril-hydrochlorothiazide (PRINZIDE,ZESTORETIC) 20-25 MG tablet Take 1 tablet by mouth daily. 06/30/17   Hali Marry, MD    Family History Family History  Problem Relation Age of Onset  . Heart attack Father   . Hypertension Father     Social History Social History   Tobacco Use  . Smoking status: Never Smoker  . Smokeless tobacco: Never Used  Substance Use Topics  . Alcohol use: Yes  . Drug use: No     Allergies   Patient has no known allergies.   Review of Systems Review of Systems  Constitutional: Positive for activity change, chills, diaphoresis, fatigue and fever.  Gastrointestinal: Positive for abdominal pain and nausea.  Genitourinary: Positive for frequency, testicular pain and urgency. Negative for discharge, dysuria, flank pain, genital sores, hematuria, penile pain, penile swelling and scrotal swelling.  All other systems reviewed and are negative.    Physical Exam Triage Vital Signs ED Triage Vitals  Enc Vitals Group     BP 11/04/17 1209 123/87     Pulse Rate 11/04/17 1209 91     Resp --      Temp 11/04/17 1209 98.4 F (36.9 C)     Temp Source 11/04/17 1209 Oral     SpO2 11/04/17 1209 97 %     Weight 11/04/17 1210 (!) 334 lb (151.5 kg)     Height 11/04/17 1210 6\' 1"  (1.854 m)     Head Circumference --      Peak Flow --  Pain Score 11/04/17 1210 0     Pain Loc --      Pain Edu? --      Excl. in [place]? --    No data found.  Updated Vital Signs BP 123/87 (BP Location: Right Arm)   Pulse 91   Temp 98.4 F (36.9 C) (Oral)   Ht 6\' 1"  (1.854 m)   Wt (!) 334 lb (151.5 kg)   SpO2 97%   BMI 44.07 kg/m   Visual Acuity Right Eye Distance:   Left Eye Distance:   Bilateral Distance:    Right Eye Near:   Left Eye Near:    Bilateral Near:     Physical Exam  Constitutional: He appears well-developed and well-nourished. No distress.  HENT:  Head: Normocephalic.  Right Ear: External ear normal.  Left Ear: External ear normal.   Nose: Nose normal.  Mouth/Throat: Oropharynx is clear and moist.  Eyes: Pupils are equal, round, and reactive to light. Conjunctivae are normal.  Neck: Neck supple.  Cardiovascular: Normal heart sounds.  Pulmonary/Chest: Breath sounds normal.  Abdominal: Soft. Bowel sounds are normal. There is no hepatosplenomegaly. There is tenderness in the suprapubic area. There is no guarding, no CVA tenderness and no tenderness at McBurney's point.    Genitourinary: Penis normal. Cremasteric reflex is present. Right testis shows tenderness.  Genitourinary Comments: No scrotal swelling.  There is mild tenderness to palpation over the right epididymus.   Musculoskeletal: He exhibits no edema.  Lymphadenopathy:    He has no cervical adenopathy.  Neurological: He is alert.  Skin: Skin is warm and dry. No rash noted.  Nursing note and vitals reviewed.    UC Treatments / Results  Labs (all labs ordered are listed, but only abnormal results are displayed) Labs Reviewed  POCT URINALYSIS DIP (MANUAL ENTRY) - Abnormal; Notable for the following components:      Result Value   Bilirubin, UA small (*)    Protein Ur, POC trace (*)    All other components within normal limits  URINE CULTURE  POCT CBC W AUTO DIFF ([facility]):  WBC 10.1; LY 27.4; MO 8.6; GR 64.0; Hgb 14.7; Platelets 230     EKG None  Radiology No results found.  Procedures Procedures (including critical care time)  Medications Ordered in UC Medications - No data to display  Initial Impression / Assessment and Plan / UC Course  I have reviewed the triage vital signs and the nursing notes.  Pertinent labs & imaging results that were available during my care of the patient were reviewed by me and considered in my medical decision making (see chart for details).    Normal CBC reassuring. Urine culture pending.  Begin Levaquin. Followup with Family Doctor if not improved in about 4 days.   Final Clinical  Impressions(s) / UC Diagnoses   Final diagnoses:  Acute cystitis without hematuria  Epididymitis     Discharge Instructions     May take Ibuprofen 200mg , 4 tabs every 8 hours with food as needed for pain.  Increase fluid intake. If symptoms become significantly worse during the night or over the weekend, proceed to the local emergency room.     ED Prescriptions    Medication Sig Dispense Auth. Provider   levofloxacin (LEVAQUIN) 500 MG tablet Take 1 tablet (500 mg total) by mouth daily for 10 days. 10 tablet Kandra Nicolas, MD        Kandra Nicolas, MD 11/06/17 1800

## 2017-11-06 ENCOUNTER — Telehealth: Payer: Self-pay | Admitting: Emergency Medicine

## 2017-11-06 LAB — URINE CULTURE
MICRO NUMBER:: 90750303
RESULT: NO GROWTH
SPECIMEN QUALITY: ADEQUATE

## 2017-11-07 NOTE — Telephone Encounter (Signed)
Pt called back given lab reults

## 2017-11-07 NOTE — Telephone Encounter (Signed)
LM to CB for lab results.

## 2017-11-08 LAB — POCT CBC W AUTO DIFF (K'VILLE URGENT CARE)

## 2017-11-17 ENCOUNTER — Encounter: Payer: Self-pay | Admitting: Family Medicine

## 2017-11-17 ENCOUNTER — Ambulatory Visit: Payer: 59 | Admitting: Family Medicine

## 2017-11-17 VITALS — BP 112/74 | HR 79 | Ht 73.0 in | Wt 341.0 lb

## 2017-11-17 DIAGNOSIS — R102 Pelvic and perineal pain: Secondary | ICD-10-CM

## 2017-11-17 DIAGNOSIS — N451 Epididymitis: Secondary | ICD-10-CM | POA: Diagnosis not present

## 2017-11-17 DIAGNOSIS — Z125 Encounter for screening for malignant neoplasm of prostate: Secondary | ICD-10-CM | POA: Diagnosis not present

## 2017-11-17 LAB — POC HEMOCCULT BLD/STL (OFFICE/1-CARD/DIAGNOSTIC): FECAL OCCULT BLD: NEGATIVE

## 2017-11-17 LAB — PSA: PSA: 1 ng/mL (ref <=4.0)

## 2017-11-17 NOTE — Progress Notes (Signed)
Subjective:    Patient ID: Adam Santos, male    DOB: January 05, 1971, 47 y.o.   MRN: [id number]  HPI 47 yo male is here today to follow-up for urgent care visit approximately 2 weeks ago on June 22.  He came in with suprapubic pressure-like sensation as well as some urinary urgency frequency and hesitancy.  He is not having any dysuria but reports that he did run a fever to 101.8 at home.  On exam he had some suprapubic tenderness as well as some tenderness over the right testes but no scrotal swelling.  Some tenderness over the right epididymis as well.  Herbert Pun him with epididymitis and started him on ibuprofen as needed and put him on Levaquin for 10 days.  Culture was negative.  He says he actually felt tremendously better within 24 hours and by 48 hours he was pretty much 100% better.  He did finish out all the antibiotics.  He did go online and read that most of it is caused by gonorrhea and chlamydia.   Review of Systems   BP 112/74   Pulse 79   Ht 6\' 1"  (1.854 m)   Wt (!) 341 lb (154.7 kg)   SpO2 98%   BMI 44.99 kg/m     No Known Allergies  Past Medical History:  Diagnosis Date  . Hyperlipidemia   . Undescended testicle     Past Surgical History:  Procedure Laterality Date  . TESTICLE SURGERY    . tonsills removed      Social History   Socioeconomic History  . Marital status: Married    Spouse name: Not on file  . Number of children: Not on file  . Years of education: Not on file  . Highest education level: Not on file  Occupational History  . Not on file  Social Needs  . Financial resource strain: Not on file  . Food insecurity:    Worry: Not on file    Inability: Not on file  . Transportation needs:    Medical: Not on file    Non-medical: Not on file  Tobacco Use  . Smoking status: Never Smoker  . Smokeless tobacco: Never Used  Substance and Sexual Activity  . Alcohol use: Yes  . Drug use: No  . Sexual activity: Yes    Partners: Female  Lifestyle  .  Physical activity:    Days per week: Not on file    Minutes per session: Not on file  . Stress: Not on file  Relationships  . Social connections:    Talks on phone: Not on file    Gets together: Not on file    Attends religious service: Not on file    Active member of club or organization: Not on file    Attends meetings of clubs or organizations: Not on file    Relationship status: Not on file  . Intimate partner violence:    Fear of current or ex partner: Not on file    Emotionally abused: Not on file    Physically abused: Not on file    Forced sexual activity: Not on file  Other Topics Concern  . Not on file  Social History Narrative  . Not on file    Family History  Problem Relation Age of Onset  . Heart attack Father   . Hypertension Father     Outpatient Encounter Medications as of 11/17/2017  Medication Sig  . lisinopril-hydrochlorothiazide (PRINZIDE,ZESTORETIC) 20-25 MG tablet Take 1  tablet by mouth daily.   No facility-administered encounter medications on file as of 11/17/2017.        Objective:   Physical Exam  Constitutional: He is oriented to person, place, and time. He appears well-developed and well-nourished.  HENT:  Head: Normocephalic and atraumatic.  Eyes: Conjunctivae and EOM are normal.  Cardiovascular: Normal rate.  Pulmonary/Chest: Effort normal.  Genitourinary: Rectum normal. Rectal exam shows no mass, no tenderness and guaiac negative stool. Prostate is enlarged. Prostate is not tender.  Neurological: He is alert and oriented to person, place, and time.  Skin: Skin is dry. No pallor.  Psychiatric: He has a normal mood and affect. His behavior is normal.  Vitals reviewed.       Assessment & Plan:  Epididymitis-he is actually feeling much better and has responded really well to the Levaquin.  We discussed options today.  We will move forward with urine drug screen testing.  Prostate exam performed today.  We will also check PSA.

## 2017-11-21 LAB — C. TRACHOMATIS/N. GONORRHOEAE RNA
C. TRACHOMATIS RNA, TMA: NOT DETECTED
N. gonorrhoeae RNA, TMA: NOT DETECTED

## 2017-11-21 NOTE — Progress Notes (Signed)
Call pt: PSA look great and stable.  No blood in hemoccult.  No GC/Chlamydia.

## 2017-12-26 ENCOUNTER — Other Ambulatory Visit: Payer: Self-pay | Admitting: Family Medicine

## 2018-01-23 ENCOUNTER — Other Ambulatory Visit: Payer: Self-pay | Admitting: Family Medicine

## 2018-04-22 ENCOUNTER — Other Ambulatory Visit: Payer: Self-pay | Admitting: Family Medicine

## 2018-05-24 ENCOUNTER — Other Ambulatory Visit: Payer: Self-pay | Admitting: Family Medicine

## 2018-06-21 ENCOUNTER — Ambulatory Visit: Payer: 59 | Admitting: Family Medicine

## 2018-06-21 ENCOUNTER — Encounter: Payer: Self-pay | Admitting: Family Medicine

## 2018-06-21 VITALS — BP 140/82 | HR 89 | Ht 73.0 in | Wt 357.0 lb

## 2018-06-21 DIAGNOSIS — R7309 Other abnormal glucose: Secondary | ICD-10-CM

## 2018-06-21 DIAGNOSIS — I10 Essential (primary) hypertension: Secondary | ICD-10-CM | POA: Diagnosis not present

## 2018-06-21 DIAGNOSIS — E119 Type 2 diabetes mellitus without complications: Secondary | ICD-10-CM | POA: Diagnosis not present

## 2018-06-21 DIAGNOSIS — L719 Rosacea, unspecified: Secondary | ICD-10-CM

## 2018-06-21 DIAGNOSIS — Z23 Encounter for immunization: Secondary | ICD-10-CM | POA: Diagnosis not present

## 2018-06-21 DIAGNOSIS — Z114 Encounter for screening for human immunodeficiency virus [HIV]: Secondary | ICD-10-CM

## 2018-06-21 DIAGNOSIS — R748 Abnormal levels of other serum enzymes: Secondary | ICD-10-CM

## 2018-06-21 LAB — POCT GLYCOSYLATED HEMOGLOBIN (HGB A1C): HEMOGLOBIN A1C: 6.6 % — AB (ref 4.0–5.6)

## 2018-06-21 MED ORDER — LISINOPRIL-HYDROCHLOROTHIAZIDE 20-25 MG PO TABS: 1.0000 | ORAL_TABLET | Freq: Every day | ORAL | 1 refills | Status: DC

## 2018-06-21 MED ORDER — METFORMIN HCL ER 500 MG PO TB24: 500.0000 mg | ORAL_TABLET | Freq: Every day | ORAL | 1 refills | Status: DC

## 2018-06-21 MED ORDER — BLOOD GLUCOSE METER KIT: PACK | 0 refills | Status: AC

## 2018-06-21 MED ORDER — METRONIDAZOLE 0.75 % EX GEL: 1.0000 "application " | Freq: Two times a day (BID) | CUTANEOUS | 3 refills | Status: AC

## 2018-06-21 NOTE — Progress Notes (Signed)
Subjective:    CC: BP and glucose  HPI:  Hypertension- Pt denies chest pain, SOB, dizziness, or heart palpitations.  Taking meds as directed w/o problems.  Denies medication side effects.  Currently on lisinopril HCT.  He is actually been out of his blood pressure pill for the last 2 days.  Impaired fasting glucose-no increased thirst or urination. No symptoms consistent with hypoglycemia.  Hypertriglyceridemia-not currently on medication.  BMI 47 -he is gained 16 pounds since I last saw him in July.  He also wanted to discuss his skin.  He says he has been getting red bumps and almost what looks like acne on his forehead and cheeks.  He says he is never had problems with breakouts or skin problems.  He wonders if it could be stress though he says normally he really takes stress at work in stride and he actually really enjoys his job.  He says he really gets good rest and sleeps well.  He does drink a fair amount of caffeine anywhere between 24 to 32 ounces of day.  He denies any excess sunlight exposure.  Past medical history, Surgical history, Family history not pertinant except as noted below, Social history, Allergies, and medications have been entered into the medical record, reviewed, and corrections made.   Review of Systems: No fevers, chills, night sweats, weight loss, chest pain, or shortness of breath.   Objective:    General: Well Developed, well nourished, and in no acute distress.  Neuro: Alert and oriented x3, extra-ocular muscles intact, sensation grossly intact.  HEENT: Normocephalic, atraumatic  Skin: Warm and dry.  His forehead and across his cheeks and nasal nose he has erythematous maculopapular rash.  No discrete pustules on exam. Cardiac: Regular rate and rhythm, no murmurs rubs or gallops, no lower extremity edema.  Respiratory: Clear to auscultation bilaterally. Not using accessory muscles, speaking in full sentences.   Impression and Recommendations:    HTN  -borderline today but will restart medication he says that when he has checked it it is but looks great on the medication and is tolerated it well.  90-day supply sent with refills.  He is due for CMP and lipid panel.  Diabetes-new diagnosis today.  We have been following him for prediabetes for quite some time.  In fact his last hemoglobin A1c was in February of last year was 5.8.  Today it was elevated at 6.6.  So new diagnosis of diabetes.  We discussed this with him today.  Prescription given for glucometer and meter to start checking his sugars a couple times per week for seeing in the morning fasting.  We also discussed putting him on metformin in addition to working on weight loss, healthy diet and regular exercise.  Potential side effects.  I would like to get him in for nutrition counseling but being that he works out of state during the workweek I do not think this will be possible currently so for now just continue to work on cutting out sugars and carbs.  Hypertriglyceridemia-to recheck lipid levels.  Rash on face-most consistent with rosacea based on appearance today.  Recommend treatment with metronidazole.  At also like to work on getting his blood sugars under control as is his weight I think this would help.  We also discussed maybe limiting his caffeine as well.  Flu Vaccine given today.

## 2018-06-26 ENCOUNTER — Encounter: Payer: Self-pay | Admitting: Family Medicine

## 2018-06-28 LAB — COMPLETE METABOLIC PANEL WITH GFR
AG RATIO: 1.7 (calc) (ref 1.0–2.5)
ALT: 83 U/L — AB (ref 9–46)
AST: 51 U/L — AB (ref 10–40)
Albumin: 4.2 g/dL (ref 3.6–5.1)
Alkaline phosphatase (APISO): 46 U/L (ref 36–130)
BUN: 15 mg/dL (ref 7–25)
CO2: 28 mmol/L (ref 20–32)
Calcium: 9.6 mg/dL (ref 8.6–10.3)
Chloride: 103 mmol/L (ref 98–110)
Creat: 1.2 mg/dL (ref 0.60–1.35)
GFR, Est African American: 82 mL/min/{1.73_m2} (ref >=60)
GFR, Est Non African American: 71 mL/min/{1.73_m2} (ref >=60)
GLUCOSE: 130 mg/dL — AB (ref 65–99)
Globulin: 2.5 g/dL (calc) (ref 1.9–3.7)
POTASSIUM: 4.4 mmol/L (ref 3.5–5.3)
Sodium: 140 mmol/L (ref 135–146)
Total Bilirubin: 0.9 mg/dL (ref 0.2–1.2)
Total Protein: 6.7 g/dL (ref 6.1–8.1)

## 2018-06-28 LAB — HEPATITIS PANEL, ACUTE
Hep A IgM: NONREACTIVE
Hep B C IgM: NONREACTIVE
Hepatitis B Surface Ag: NONREACTIVE
Hepatitis C Ab: NONREACTIVE
SIGNAL TO CUT-OFF: 0.02 (ref <1.00)

## 2018-06-28 LAB — TEST AUTHORIZATION

## 2018-06-28 LAB — LIPID PANEL
CHOLESTEROL: 217 mg/dL — AB (ref <200)
HDL: 35 mg/dL — ABNORMAL LOW (ref >=40)
Non-HDL Cholesterol (Calc): 182 mg/dL (calc) — ABNORMAL HIGH (ref <130)
TRIGLYCERIDES: 574 mg/dL — AB (ref <150)
Total CHOL/HDL Ratio: 6.2 (calc) — ABNORMAL HIGH (ref <5.0)

## 2018-06-28 LAB — TSH: TSH: 1.57 mIU/L (ref 0.40–4.50)

## 2018-06-28 LAB — HIV ANTIBODY (ROUTINE TESTING W REFLEX): HIV 1&2 Ab, 4th Generation: NONREACTIVE

## 2018-06-29 ENCOUNTER — Other Ambulatory Visit: Payer: Self-pay

## 2018-06-29 MED ORDER — METFORMIN HCL ER 500 MG PO TB24: 500.0000 mg | ORAL_TABLET | Freq: Every day | ORAL | 0 refills | Status: DC

## 2018-07-05 NOTE — Addendum Note (Signed)
Addended by: Beatrice Lecher D on: 07/05/2018 07:52 AM   Modules accepted: Orders

## 2018-07-12 ENCOUNTER — Ambulatory Visit (HOSPITAL_COMMUNITY): Payer: 59

## 2018-09-28 ENCOUNTER — Encounter: Payer: Self-pay | Admitting: Family Medicine

## 2018-09-28 ENCOUNTER — Ambulatory Visit (INDEPENDENT_AMBULATORY_CARE_PROVIDER_SITE_OTHER): Payer: 59 | Admitting: Family Medicine

## 2018-09-28 VITALS — BP 121/82 | HR 64 | Temp 97.9°F | Ht 73.0 in | Wt 337.0 lb

## 2018-09-28 DIAGNOSIS — E119 Type 2 diabetes mellitus without complications: Secondary | ICD-10-CM | POA: Insufficient documentation

## 2018-09-28 DIAGNOSIS — I10 Essential (primary) hypertension: Secondary | ICD-10-CM | POA: Diagnosis not present

## 2018-09-28 DIAGNOSIS — R748 Abnormal levels of other serum enzymes: Secondary | ICD-10-CM | POA: Diagnosis not present

## 2018-09-28 MED ORDER — METFORMIN HCL ER 500 MG PO TB24: 500.0000 mg | ORAL_TABLET | Freq: Every day | ORAL | 2 refills | Status: DC

## 2018-09-28 NOTE — Progress Notes (Signed)
Virtual Visit via Video Note  I connected with Adam Santos on 09/28/18 at  1:20 PM EDT by a video enabled telemedicine application and verified that I am speaking with the correct person using two identifiers.   I discussed the limitations of evaluation and management by telemedicine and the availability of in person appointments. The patient expressed understanding and agreed to proceed.  Pt was at home and I was in my office for the virtual visit.     Subjective:    CC: BP and glucose  HPI:  Hypertension- Pt denies chest pain, SOB, dizziness, or heart palpitations.  Taking meds as directed w/o problems.  Denies medication side effects.  Some I saw him February he had actually been out of his blood pressure medications for a few days. No swelling.   Diabetes -was newly diagnosed in February.  No hypoglycemic events. No wounds or sores that are not healing well. No increased thirst or urination. Checking glucose at home and running around 100.  We also started him on metformin at that visit.  He has not been taking it.  Because he works out of town he was not able to participate in nutrition counseling sessions.  He is also been noted to have liver enzymes on a couple different occasions.  But back in February both of his enzymes were elevated and history glycerides were also extremely high.  Past medical history, Surgical history, Family history not pertinant except as noted below, Social history, Allergies, and medications have been entered into the medical record, reviewed, and corrections made.   Review of Systems: No fevers, chills, night sweats, weight loss, chest pain, or shortness of breath.   Objective:    General: Speaking clearly in complete sentences without any shortness of breath.  Alert and oriented x3.  Normal judgment. No apparent acute distress.  Well-groomed.  Sitting on his deck.well groomed     Impression and Recommendations:   HTN - home BP well controlled.   F/U in 4 months. Exercising some.  Says he may try to find a lab where he works which is up [place] he will be leaving on Monday to go back to work.  He is also welcome to the lab today as they are open until 430.  DM - due for A1C but sounds like glucose has been well controlled. He is checking it twice a week.  Tinea with metformin.  Hypertriglyceridemia- due to recheck and consider a statin.   Elevated liver enzymes-acute hepatitis panel was negative I would like to recheck those enzymes.  If still elevated then recommend ultrasound of the liver for further work-up.     I discussed the assessment and treatment plan with the patient. The patient was provided an opportunity to ask questions and all were answered. The patient agreed with the plan and demonstrated an understanding of the instructions.   The patient was advised to call back or seek an in-person evaluation if the symptoms worsen or if the condition fails to improve as anticipated.   Beatrice Lecher, MD

## 2018-12-14 ENCOUNTER — Other Ambulatory Visit: Payer: Self-pay | Admitting: Family Medicine

## 2018-12-14 DIAGNOSIS — I10 Essential (primary) hypertension: Secondary | ICD-10-CM

## 2018-12-14 DIAGNOSIS — R7309 Other abnormal glucose: Secondary | ICD-10-CM

## 2019-05-14 ENCOUNTER — Other Ambulatory Visit: Payer: Self-pay | Admitting: Family Medicine

## 2019-05-14 DIAGNOSIS — R7309 Other abnormal glucose: Secondary | ICD-10-CM

## 2019-05-14 DIAGNOSIS — I10 Essential (primary) hypertension: Secondary | ICD-10-CM

## 2019-06-18 ENCOUNTER — Telehealth: Payer: Self-pay | Admitting: *Deleted

## 2019-06-18 DIAGNOSIS — I10 Essential (primary) hypertension: Secondary | ICD-10-CM

## 2019-06-18 DIAGNOSIS — R7309 Other abnormal glucose: Secondary | ICD-10-CM

## 2019-06-18 MED ORDER — LISINOPRIL-HYDROCHLOROTHIAZIDE 20-25 MG PO TABS: 1.0000 | ORAL_TABLET | Freq: Every day | ORAL | 0 refills | Status: DC

## 2019-06-18 MED ORDER — METFORMIN HCL ER 500 MG PO TB24: 500.0000 mg | ORAL_TABLET | Freq: Every day | ORAL | 0 refills | Status: DC

## 2019-06-18 NOTE — Telephone Encounter (Signed)
Med refill request.Adam Santos, [name], CMA

## 2019-07-09 ENCOUNTER — Encounter: Payer: Self-pay | Admitting: Family Medicine

## 2019-07-09 ENCOUNTER — Ambulatory Visit (INDEPENDENT_AMBULATORY_CARE_PROVIDER_SITE_OTHER): Payer: 59 | Admitting: Family Medicine

## 2019-07-09 VITALS — BP 142/89 | HR 90 | Ht 73.0 in | Wt 343.0 lb

## 2019-07-09 DIAGNOSIS — I10 Essential (primary) hypertension: Secondary | ICD-10-CM

## 2019-07-09 DIAGNOSIS — R748 Abnormal levels of other serum enzymes: Secondary | ICD-10-CM | POA: Diagnosis not present

## 2019-07-09 DIAGNOSIS — R7309 Other abnormal glucose: Secondary | ICD-10-CM

## 2019-07-09 DIAGNOSIS — Z125 Encounter for screening for malignant neoplasm of prostate: Secondary | ICD-10-CM

## 2019-07-09 DIAGNOSIS — F439 Reaction to severe stress, unspecified: Secondary | ICD-10-CM

## 2019-07-09 DIAGNOSIS — E119 Type 2 diabetes mellitus without complications: Secondary | ICD-10-CM | POA: Diagnosis not present

## 2019-07-09 DIAGNOSIS — E781 Pure hyperglyceridemia: Secondary | ICD-10-CM | POA: Diagnosis not present

## 2019-07-09 LAB — POCT GLYCOSYLATED HEMOGLOBIN (HGB A1C): Hemoglobin A1C: 6.7 % — AB (ref 4.0–5.6)

## 2019-07-09 MED ORDER — METFORMIN HCL ER 500 MG PO TB24: 500.0000 mg | ORAL_TABLET | Freq: Every day | ORAL | 0 refills | Status: DC

## 2019-07-09 MED ORDER — LISINOPRIL-HYDROCHLOROTHIAZIDE 20-25 MG PO TABS: 1.0000 | ORAL_TABLET | Freq: Every day | ORAL | 0 refills | Status: DC

## 2019-07-09 NOTE — Assessment & Plan Note (Signed)
A1c up from previous now 6.7.  Continue with Metformin again discussed really focusing in on diet cutting back on carbs and getting regular exercise to see if this will improve his A1c.  I had like to see him back in 3 months. Lab Results  Component Value Date   HGBA1C 6.7 (A) 07/09/2019

## 2019-07-09 NOTE — Assessment & Plan Note (Signed)
And to recheck enzymes.  He never went for the ultrasound so depending on the results this time then we will discuss whether or not we need to push forward with getting an ultrasound of the liver for further evaluation.  I suspect related to underlying fatty liver

## 2019-07-09 NOTE — Assessment & Plan Note (Addendum)
Blood pressure not at goal today.  May need to consider adding to his lisinopril HCTZ.  Just really encouraged him to work on healthy diet and regular exercise and weight loss.  He is also due to repeat renal function.  No one to make any changes until we at least know what his labs are doing.  Plan to recheck again in 3 months.

## 2019-07-09 NOTE — Progress Notes (Addendum)
Established Patient Office Visit  Subjective:  Patient ID: Adam Santos, male    DOB: 17-Apr-1971  Age: 49 y.o. MRN: [id number]  CC:  Chief Complaint  Patient presents with  . Diabetes  . Hypertension    HPI LEV CERVONE presents for   Hypertension- Pt denies chest pain, SOB, dizziness, or heart palpitations.  Taking meds as directed w/o problems.  Denies medication side effects.   Diabetes - no hypoglycemic events. No wounds or sores that are not healing well. No increased thirst or urination. Checking glucose at home. Taking medications as prescribed without any side effects.  He has been out of his Metformin for a little over a week. Not on a statin  Due to recheck liver enzymes - due to recheck enzymes.  In fact we had referred him for abdominal ultrasound with elastography last May but he was unable to make the appointment.  Hyperlipidemia - not no a statin. Plan to recheck labs.   He did want to let me know that he has been struggling with just feeling a little bit more down and just not wanting to feel motivated he is also been a little bit more angry and irritable.  He feels like some of its job/stress related.  He mostly works in [place] and has to come to travel and can you.  Also is a lot of stress on him to feel like he is the primary breadwinner for his family.  Past Medical History:  Diagnosis Date  . Hyperlipidemia   . Undescended testicle     Past Surgical History:  Procedure Laterality Date  . TESTICLE SURGERY    . tonsills removed      Family History  Problem Relation Age of Onset  . Heart attack Father   . Hypertension Father     Social History   Socioeconomic History  . Marital status: Married    Spouse name: Not on file  . Number of children: Not on file  . Years of education: Not on file  . Highest education level: Not on file  Occupational History  . Not on file  Tobacco Use  . Smoking status: Never Smoker  . Smokeless tobacco: Never  Used  Substance and Sexual Activity  . Alcohol use: Yes  . Drug use: No  . Sexual activity: Yes    Partners: Female  Other Topics Concern  . Not on file  Social History Narrative  . Not on file   Social Determinants of Health   Financial Resource Strain:   . Difficulty of Paying Living Expenses: Not on file  Food Insecurity:   . Worried About Charity fundraiser in the Last Year: Not on file  . Ran Out of Food in the Last Year: Not on file  Transportation Needs:   . Lack of Transportation (Medical): Not on file  . Lack of Transportation (Non-Medical): Not on file  Physical Activity:   . Days of Exercise per Week: Not on file  . Minutes of Exercise per Session: Not on file  Stress:   . Feeling of Stress : Not on file  Social Connections:   . Frequency of Communication with Friends and Family: Not on file  . Frequency of Social Gatherings with Friends and Family: Not on file  . Attends Religious Services: Not on file  . Active Member of Clubs or Organizations: Not on file  . Attends Archivist Meetings: Not on file  . Marital Status: Not  on file  Intimate Partner Violence:   . Fear of Current or Ex-Partner: Not on file  . Emotionally Abused: Not on file  . Physically Abused: Not on file  . Sexually Abused: Not on file    Outpatient Medications Prior to Visit  Medication Sig Dispense Refill  . blood glucose meter kit and supplies Dispense based on patient and insurance preference. For testing blood sugars daily. Dx: E11.9 1 each 0  . Lancets (ONETOUCH DELICA PLUS [id number]) MISC USE TO TEST QD    . metroNIDAZOLE (METROGEL) 0.75 % gel Apply 1 application topically 2 (two) times daily. 45 g 3  . ONE TOUCH ULTRA TEST test strip USE TO TEST QD    . lisinopril-hydrochlorothiazide (ZESTORETIC) 20-25 MG tablet Take 1 tablet by mouth daily. 2 WEEK SUPPLY GIVEN. APPOINTMENT AND LABS REQUIRED FOR REFILLS 15 tablet 0  . metFORMIN (GLUCOPHAGE-XR) 500 MG 24 hr tablet Take 1  tablet (500 mg total) by mouth daily with breakfast. 2 WEEK SUPPLY GIVEN. APPOINTMENT AND LABS REQUIRED FOR REFILLS 15 tablet 0   No facility-administered medications prior to visit.    No Known Allergies  ROS Review of Systems    Objective:    Physical Exam  Constitutional: He is oriented to person, place, and time. He appears well-developed and well-nourished.  HENT:  Head: Normocephalic and atraumatic.  Cardiovascular: Normal rate, regular rhythm and normal heart sounds.  Pulmonary/Chest: Effort normal and breath sounds normal.  Neurological: He is alert and oriented to person, place, and time.  Skin: Skin is warm and dry.  Psychiatric: He has a normal mood and affect. His behavior is normal.    BP (!) 142/89   Pulse 90   Ht '6\' 1"'$  (1.854 m)   Wt (!) 343 lb (155.6 kg)   SpO2 98%   BMI 45.25 kg/m  Wt Readings from Last 3 Encounters:  07/09/19 (!) 343 lb (155.6 kg)  09/28/18 (!) 337 lb (152.9 kg)  06/21/18 (!) 357 lb (161.9 kg)     Health Maintenance Due  Topic Date Due  . PNEUMOCOCCAL POLYSACCHARIDE VACCINE AGE 33-64 HIGH RISK  07/08/1971  . FOOT EXAM  07/08/1979  . OPHTHALMOLOGY EXAM  07/08/1979  . COLONOSCOPY  07/08/2019    There are no preventive care reminders to display for this patient.  Lab Results  Component Value Date   TSH 1.57 06/22/2018   Lab Results  Component Value Date   WBC 7.0 01/16/2014   HGB 14.7 01/16/2014   HCT 42.3 01/16/2014   MCV 91.4 01/16/2014   PLT 225 01/16/2014   Lab Results  Component Value Date   NA 140 06/22/2018   K 4.4 06/22/2018   CO2 28 06/22/2018   GLUCOSE 130 (H) 06/22/2018   BUN 15 06/22/2018   CREATININE 1.20 06/22/2018   BILITOT 0.9 06/22/2018   ALKPHOS 56 12/27/2016   AST 51 (H) 06/22/2018   ALT 83 (H) 06/22/2018   PROT 6.7 06/22/2018   ALBUMIN 4.2 12/27/2016   CALCIUM 9.6 06/22/2018   Lab Results  Component Value Date   CHOL 217 (H) 06/22/2018   Lab Results  Component Value Date   HDL 35 (L)  06/22/2018   Lab Results  Component Value Date   [facility]  06/22/2018     Comment:     . LDL cholesterol not calculated. Triglyceride levels greater than 400 mg/dL invalidate calculated LDL results. . Reference range: <100 . Desirable range <100 mg/dL for primary prevention;   <70 mg/dL  for patients with CHD or diabetic patients  with > or = 2 CHD risk factors. Marland Kitchen LDL-C is now calculated using the Martin-Hopkins  calculation, which is a validated novel method providing  better accuracy than the Friedewald equation in the  estimation of LDL-C.  Cresenciano Genre et al. Annamaria Helling. 0947;096(28): 2061-2068  (http://education.[facility].com/faq/FAQ164)    Lab Results  Component Value Date   TRIG 574 (H) 06/22/2018   Lab Results  Component Value Date   CHOLHDL 6.2 (H) 06/22/2018   Lab Results  Component Value Date   HGBA1C 6.7 (A) 07/09/2019      Assessment & Plan:   Problem List Items Addressed This Visit      Cardiovascular and Mediastinum   Hypertension - Primary    Blood pressure not at goal today.  May need to consider adding to his lisinopril HCTZ.  Just really encouraged him to work on healthy diet and regular exercise and weight loss.  He is also due to repeat renal function.  No one to make any changes until we at least know what his labs are doing.  Plan to recheck again in 3 months.      Relevant Medications   lisinopril-hydrochlorothiazide (ZESTORETIC) 20-25 MG tablet   Other Relevant Orders   Lipid panel   COMPLETE METABOLIC PANEL WITH GFR     Endocrine   Type 2 diabetes mellitus without complication, without long-term current use of insulin (HCC)    A1c up from previous now 6.7.  Continue with Metformin again discussed really focusing in on diet cutting back on carbs and getting regular exercise to see if this will improve his A1c.  I had like to see him back in 3 months. Lab Results  Component Value Date   HGBA1C 6.7 (A) 07/09/2019         Relevant  Medications   lisinopril-hydrochlorothiazide (ZESTORETIC) 20-25 MG tablet   metFORMIN (GLUCOPHAGE-XR) 500 MG 24 hr tablet   Other Relevant Orders   POCT glycosylated hemoglobin (Hb A1C) (Completed)   Lipid panel   COMPLETE METABOLIC PANEL WITH GFR     Other   HYPERTRIGLYCERIDEMIA    Discussed need for statin based on his diabetes dx.        Relevant Medications   lisinopril-hydrochlorothiazide (ZESTORETIC) 20-25 MG tablet   Other Relevant Orders   Lipid panel   COMPLETE METABOLIC PANEL WITH GFR   Elevated liver enzymes    And to recheck enzymes.  He never went for the ultrasound so depending on the results this time then we will discuss whether or not we need to push forward with getting an ultrasound of the liver for further evaluation.  I suspect related to underlying fatty liver      Relevant Orders   Lipid panel   COMPLETE METABOLIC PANEL WITH GFR    Other Visit Diagnoses    Elevated glucose       Relevant Medications   lisinopril-hydrochlorothiazide (ZESTORETIC) 20-25 MG tablet   Screening for prostate cancer       Relevant Orders   PSA   Stress         Stress-we discussed options.  I think it might be helpful for him to even consider a life coach or maybe even a therapist/counselor.  It sounds like he is no longer passionate about his current job but feels a little bit stuck with Covid, and that he feels like it may be difficult to try to find a new job right now. Also  discussed medication as an options if symptoms progress or become more severe.  GAD-7 score of 4.  Meds ordered this encounter  Medications  . lisinopril-hydrochlorothiazide (ZESTORETIC) 20-25 MG tablet    Sig: Take 1 tablet by mouth daily.    Dispense:  90 tablet    Refill:  0  . metFORMIN (GLUCOPHAGE-XR) 500 MG 24 hr tablet    Sig: Take 1 tablet (500 mg total) by mouth daily with breakfast.    Dispense:  90 tablet    Refill:  0    Follow-up: Return in about 3 months (around 10/06/2019) for  Diabetes follow-up.    Beatrice Lecher, MD

## 2019-07-09 NOTE — Assessment & Plan Note (Signed)
Discussed need for statin based on his diabetes dx.

## 2019-10-04 ENCOUNTER — Other Ambulatory Visit: Payer: Self-pay | Admitting: *Deleted

## 2019-10-04 DIAGNOSIS — I10 Essential (primary) hypertension: Secondary | ICD-10-CM

## 2019-10-04 DIAGNOSIS — E119 Type 2 diabetes mellitus without complications: Secondary | ICD-10-CM

## 2019-10-04 DIAGNOSIS — R7309 Other abnormal glucose: Secondary | ICD-10-CM

## 2019-10-04 MED ORDER — METFORMIN HCL ER 500 MG PO TB24: 500.0000 mg | ORAL_TABLET | Freq: Every day | ORAL | 0 refills | Status: DC

## 2019-10-04 MED ORDER — LISINOPRIL-HYDROCHLOROTHIAZIDE 20-25 MG PO TABS: 1.0000 | ORAL_TABLET | Freq: Every day | ORAL | 0 refills | Status: DC

## 2020-01-01 ENCOUNTER — Other Ambulatory Visit: Payer: Self-pay | Admitting: Family Medicine

## 2020-01-01 DIAGNOSIS — R7309 Other abnormal glucose: Secondary | ICD-10-CM

## 2020-01-01 DIAGNOSIS — I10 Essential (primary) hypertension: Secondary | ICD-10-CM

## 2020-01-01 DIAGNOSIS — E119 Type 2 diabetes mellitus without complications: Secondary | ICD-10-CM

## 2020-01-06 ENCOUNTER — Encounter: Payer: Self-pay | Admitting: Family Medicine

## 2020-01-06 ENCOUNTER — Other Ambulatory Visit: Payer: Self-pay | Admitting: *Deleted

## 2020-01-06 ENCOUNTER — Ambulatory Visit (INDEPENDENT_AMBULATORY_CARE_PROVIDER_SITE_OTHER): Payer: 59 | Admitting: Family Medicine

## 2020-01-06 VITALS — BP 109/64 | HR 80 | Ht 73.0 in | Wt 297.0 lb

## 2020-01-06 DIAGNOSIS — E119 Type 2 diabetes mellitus without complications: Secondary | ICD-10-CM

## 2020-01-06 DIAGNOSIS — Z1211 Encounter for screening for malignant neoplasm of colon: Secondary | ICD-10-CM

## 2020-01-06 DIAGNOSIS — Z125 Encounter for screening for malignant neoplasm of prostate: Secondary | ICD-10-CM

## 2020-01-06 DIAGNOSIS — I10 Essential (primary) hypertension: Secondary | ICD-10-CM | POA: Diagnosis not present

## 2020-01-06 DIAGNOSIS — E781 Pure hyperglyceridemia: Secondary | ICD-10-CM | POA: Diagnosis not present

## 2020-01-06 DIAGNOSIS — Z6839 Body mass index (BMI) 39.0-39.9, adult: Secondary | ICD-10-CM

## 2020-01-06 LAB — POCT GLYCOSYLATED HEMOGLOBIN (HGB A1C): Hemoglobin A1C: 5.7 % — AB (ref 4.0–5.6)

## 2020-01-06 MED ORDER — LISINOPRIL-HYDROCHLOROTHIAZIDE 10-12.5 MG PO TABS: 1.0000 | ORAL_TABLET | Freq: Every day | ORAL | 1 refills | Status: DC

## 2020-01-06 MED ORDER — METFORMIN HCL ER 500 MG PO TB24: 500.0000 mg | ORAL_TABLET | Freq: Every day | ORAL | 1 refills | Status: DC

## 2020-01-06 NOTE — Progress Notes (Signed)
Established Patient Office Visit  Subjective:  Patient ID: Adam Santos, male    DOB: [birth year]  Age: 49 y.o. MRN: [id number]  CC:  Chief Complaint  Patient presents with  . Diabetes  . Hypertension     HPI Adam Santos presents for   Hypertension- Pt denies chest pain, SOB, dizziness, or heart palpitations.  Taking meds as directed w/o problems.  Denies medication side effects.    Diabetes - no hypoglycemic events. No wounds or sores that are not healing well. No increased thirst or urination. Checking glucose at home. Taking medications as prescribed without any side effects.  Last eye exam was about 4 years ago.  Really made some recently great lifestyle changes he has been trying to eat less for weight loss he is actually lost about 60 pounds thus far.  Goal is to get down to about 225 pounds.  Past Medical History:  Diagnosis Date  . Hyperlipidemia   . Undescended testicle     Past Surgical History:  Procedure Laterality Date  . TESTICLE SURGERY    . tonsills removed      Family History  Problem Relation Age of Onset  . Heart attack Father   . Hypertension Father     Social History   Socioeconomic History  . Marital status: Married    Spouse name: Not on file  . Number of children: Not on file  . Years of education: Not on file  . Highest education level: Not on file  Occupational History  . Not on file  Tobacco Use  . Smoking status: Never Smoker  . Smokeless tobacco: Never Used  Vaping Use  . Vaping Use: Never used  Substance and Sexual Activity  . Alcohol use: Yes  . Drug use: No  . Sexual activity: Yes    Partners: Female  Other Topics Concern  . Not on file  Social History Narrative  . Not on file   Social Determinants of Health   Financial Resource Strain:   . Difficulty of Paying Living Expenses: Not on file  Food Insecurity:   . Worried About Charity fundraiser in the Last Year: Not on file  . Ran Out of Food in the Last Year:  Not on file  Transportation Needs:   . Lack of Transportation (Medical): Not on file  . Lack of Transportation (Non-Medical): Not on file  Physical Activity:   . Days of Exercise per Week: Not on file  . Minutes of Exercise per Session: Not on file  Stress:   . Feeling of Stress : Not on file  Social Connections:   . Frequency of Communication with Friends and Family: Not on file  . Frequency of Social Gatherings with Friends and Family: Not on file  . Attends Religious Services: Not on file  . Active Member of Clubs or Organizations: Not on file  . Attends Archivist Meetings: Not on file  . Marital Status: Not on file  Intimate Partner Violence:   . Fear of Current or Ex-Partner: Not on file  . Emotionally Abused: Not on file  . Physically Abused: Not on file  . Sexually Abused: Not on file    Outpatient Medications Prior to Visit  Medication Sig Dispense Refill  . blood glucose meter kit and supplies Dispense based on patient and insurance preference. For testing blood sugars daily. Dx: E11.9 1 each 0  . Lancets (ONETOUCH DELICA PLUS [id number]) MISC USE TO TEST QD    .  metFORMIN (GLUCOPHAGE-XR) 500 MG 24 hr tablet Take 1 tablet (500 mg total) by mouth daily with breakfast. 30 DAY SUPPLY GIVEN.PLEASE CALL OFFICE TO SCHEDULE AN APPOINTMENT 30 tablet 0  . metroNIDAZOLE (METROGEL) 0.75 % gel Apply 1 application topically 2 (two) times daily. 45 g 3  . ONE TOUCH ULTRA TEST test strip USE TO TEST QD    . lisinopril-hydrochlorothiazide (ZESTORETIC) 20-25 MG tablet Take 1 tablet by mouth daily. [place].PLEASE CALL OFFICE TO SCHEDULE AN APPOINTMENT 30 tablet 0   No facility-administered medications prior to visit.    No Known Allergies  ROS Review of Systems    Objective:    Physical Exam Constitutional:      Appearance: He is well-developed.  HENT:     Head: Normocephalic and atraumatic.  Cardiovascular:     Rate and Rhythm: Normal rate and regular  rhythm.     Heart sounds: Normal heart sounds.  Pulmonary:     Effort: Pulmonary effort is normal.     Breath sounds: Normal breath sounds.  Skin:    General: Skin is warm and dry.  Neurological:     Mental Status: He is alert and oriented to person, place, and time.  Psychiatric:        Behavior: Behavior normal.     BP 109/64   Pulse 80   Ht _0  (1.854 m)   Wt 297 lb (134.7 kg)   SpO2 98%   BMI 39.18 kg/m  Wt Readings from Last 3 Encounters:  01/06/20 297 lb (134.7 kg)  07/09/19 (!) 343 lb (155.6 kg)  09/28/18 (!) 337 lb (152.9 kg)     Health Maintenance Due  Topic Date Due  . COLONOSCOPY  Never done    There are no preventive care reminders to display for this patient.  Lab Results  Component Value Date   TSH 1.57 06/22/2018   Lab Results  Component Value Date   WBC 7.0 01/16/2014   HGB 14.7 01/16/2014   HCT 42.3 01/16/2014   MCV 91.4 01/16/2014   PLT 225 01/16/2014   Lab Results  Component Value Date   NA 140 06/22/2018   K 4.4 06/22/2018   CO2 28 06/22/2018   GLUCOSE 130 (H) 06/22/2018   BUN 15 06/22/2018   CREATININE 1.20 06/22/2018   BILITOT 0.9 06/22/2018   ALKPHOS 56 12/27/2016   AST 51 (H) 06/22/2018   ALT 83 (H) 06/22/2018   PROT 6.7 06/22/2018   ALBUMIN 4.2 12/27/2016   CALCIUM 9.6 06/22/2018   Lab Results  Component Value Date   CHOL 217 (H) 06/22/2018   Lab Results  Component Value Date   HDL 35 (L) 06/22/2018   Lab Results  Component Value Date   [facility]  06/22/2018     Comment:     . LDL cholesterol not calculated. Triglyceride levels greater than 400 mg/dL invalidate calculated LDL results. . Reference range: <100 . Desirable range <100 mg/dL for primary prevention;   <70 mg/dL for patients with CHD or diabetic patients  with > or = 2 CHD risk factors. Marland Kitchen LDL-C is now calculated using the Martin-Hopkins  calculation, which is a validated novel method providing  better accuracy than the Friedewald equation in the   estimation of LDL-C.  Cresenciano Genre et al. Annamaria Helling. 4801;655(37): 2061-2068  (http://education.[facility].com/faq/FAQ164)    Lab Results  Component Value Date   TRIG 574 (H) 06/22/2018   Lab Results  Component Value Date   CHOLHDL 6.2 (H)  06/22/2018   Lab Results  Component Value Date   HGBA1C 5.7 (A) 01/06/2020      Assessment & Plan:   Problem List Items Addressed This Visit      Cardiovascular and Mediastinum   Hypertension    He is actually doing well and blood pressures look phenomenal.  Blood pressures have been consistently right around 110 or less at home as well so much she can decrease his lisinopril HCTZ.  He says he still would ultimately like to get down to about 225 pounds.  He has really done phenomenally well and just encouraged him to continue to work at it he is going to start incorporating exercise soon.      Relevant Medications   lisinopril-hydrochlorothiazide (ZESTORETIC) 10-12.5 MG tablet   Other Relevant Orders   POCT glycosylated hemoglobin (Hb A1C) (Completed)   PSA   COMPLETE METABOLIC PANEL WITH GFR   Lipid panel     Endocrine   Type 2 diabetes mellitus without complication, without long-term current use of insulin ([place]) - Primary    See looks phenomenal today at 5.7.  We will continue current regimen with Metformin 500 mg extended release.  When I see him back if A1c continues to decrease then we may consider stopping his Metformin completely.  Encouraged him to schedule a yearly eye exam      Relevant Medications   lisinopril-hydrochlorothiazide (ZESTORETIC) 10-12.5 MG tablet   Other Relevant Orders   POCT glycosylated hemoglobin (Hb A1C) (Completed)   PSA   COMPLETE METABOLIC PANEL WITH GFR   Lipid panel     Other   HYPERTRIGLYCERIDEMIA   Relevant Medications   lisinopril-hydrochlorothiazide (ZESTORETIC) 10-12.5 MG tablet   Other Relevant Orders   POCT glycosylated hemoglobin (Hb A1C) (Completed)   PSA   COMPLETE METABOLIC PANEL  WITH GFR   Lipid panel    Other Visit Diagnoses    Screening for prostate cancer       Relevant Orders   POCT glycosylated hemoglobin (Hb A1C) (Completed)   PSA   COMPLETE METABOLIC PANEL WITH GFR   Lipid panel   Screening for malignant neoplasm of colon       Relevant Orders   Ambulatory referral to Gastroenterology   BMI 39.0-39.9,adult          Meds ordered this encounter  Medications  . lisinopril-hydrochlorothiazide (ZESTORETIC) 10-12.5 MG tablet    Sig: Take 1 tablet by mouth daily.    Dispense:  90 tablet    Refill:  1    Follow-up: Return in about 3 months (around 04/07/2020) for Diabetes follow-up, Hypertension.  Time spent 25 in in encounter  Beatrice Lecher, MD

## 2020-01-06 NOTE — Assessment & Plan Note (Signed)
He is actually doing well and blood pressures look phenomenal.  Blood pressures have been consistently right around 110 or less at home as well so much she can decrease his lisinopril HCTZ.  He says he still would ultimately like to get down to about 225 pounds.  He has really done phenomenally well and just encouraged him to continue to work at it he is going to start incorporating exercise soon.

## 2020-01-06 NOTE — Assessment & Plan Note (Addendum)
See looks phenomenal today at 5.7.  We will continue current regimen with Metformin 500 mg extended release.  When I see him back if A1c continues to decrease then we may consider stopping his Metformin completely.  Encouraged him to schedule a yearly eye exam

## 2020-01-07 LAB — COMPLETE METABOLIC PANEL WITH GFR
AG Ratio: 1.7 (calc) (ref 1.0–2.5)
ALT: 25 U/L (ref 9–46)
AST: 20 U/L (ref 10–35)
Albumin: 4.4 g/dL (ref 3.6–5.1)
Alkaline phosphatase (APISO): 51 U/L (ref 35–144)
BUN: 18 mg/dL (ref 7–25)
CO2: 27 mmol/L (ref 20–32)
Calcium: 9.9 mg/dL (ref 8.6–10.3)
Chloride: 102 mmol/L (ref 98–110)
Creat: 0.98 mg/dL (ref 0.70–1.33)
GFR, Est African American: 104 mL/min/{1.73_m2} (ref >=60)
GFR, Est Non African American: 90 mL/min/{1.73_m2} (ref >=60)
Globulin: 2.6 g/dL (calc) (ref 1.9–3.7)
Glucose, Bld: 126 mg/dL — ABNORMAL HIGH (ref 65–99)
Potassium: 3.8 mmol/L (ref 3.5–5.3)
Sodium: 138 mmol/L (ref 135–146)
Total Bilirubin: 0.6 mg/dL (ref 0.2–1.2)
Total Protein: 7 g/dL (ref 6.1–8.1)

## 2020-01-07 LAB — LIPID PANEL
Cholesterol: 227 mg/dL — ABNORMAL HIGH (ref <200)
HDL: 35 mg/dL — ABNORMAL LOW (ref >=40)
LDL Cholesterol (Calc): 140 mg/dL (calc) — ABNORMAL HIGH
Non-HDL Cholesterol (Calc): 192 mg/dL (calc) — ABNORMAL HIGH (ref <130)
Total CHOL/HDL Ratio: 6.5 (calc) — ABNORMAL HIGH (ref <5.0)
Triglycerides: 337 mg/dL — ABNORMAL HIGH (ref <150)

## 2020-01-07 LAB — PSA: PSA: 1 ng/mL (ref <=4.0)

## 2020-01-13 ENCOUNTER — Encounter: Payer: Self-pay | Admitting: Family Medicine

## 2020-01-13 MED ORDER — ATORVASTATIN CALCIUM 20 MG PO TABS: 20.0000 mg | ORAL_TABLET | Freq: Every day | ORAL | 3 refills | Status: DC

## 2020-03-16 ENCOUNTER — Encounter: Payer: Self-pay | Admitting: Family Medicine

## 2020-06-08 ENCOUNTER — Ambulatory Visit (AMBULATORY_SURGERY_CENTER): Payer: Self-pay | Admitting: *Deleted

## 2020-06-08 ENCOUNTER — Other Ambulatory Visit: Payer: Self-pay

## 2020-06-08 VITALS — Ht 73.0 in | Wt 287.0 lb

## 2020-06-08 DIAGNOSIS — Z1211 Encounter for screening for malignant neoplasm of colon: Secondary | ICD-10-CM

## 2020-06-08 MED ORDER — PLENVU 140 G PO SOLR: 1.0000 | Freq: Once | ORAL | 0 refills | Status: AC

## 2020-06-08 NOTE — Progress Notes (Signed)
Patient is here in-person for PV. Patient denies any allergies to eggs or soy. Patient denies any problems with anesthesia/sedation. Patient denies any oxygen use at home. Patient denies taking any diet/weight loss medications or blood thinners. Patient is not being treated for MRSA or C-diff. Patient is aware of our care-partner policy and [id number] safety protocol. EMMI education assigned to the patient for the procedure, sent to [place].   COVID-19 vaccines completed on 03/2020 x2, per patient.   Prep Prescription coupon given to the patient. Pt requested Plenvu b/c his wife used this prep.

## 2020-06-16 ENCOUNTER — Encounter: Payer: Self-pay | Admitting: Gastroenterology

## 2020-06-22 ENCOUNTER — Ambulatory Visit (AMBULATORY_SURGERY_CENTER): Payer: 59 | Admitting: Gastroenterology

## 2020-06-22 ENCOUNTER — Other Ambulatory Visit: Payer: Self-pay

## 2020-06-22 ENCOUNTER — Encounter: Payer: Self-pay | Admitting: Gastroenterology

## 2020-06-22 VITALS — BP 101/65 | HR 69 | Temp 98.0°F | Resp 16 | Ht 73.0 in | Wt 287.0 lb

## 2020-06-22 DIAGNOSIS — K635 Polyp of colon: Secondary | ICD-10-CM

## 2020-06-22 DIAGNOSIS — Z1211 Encounter for screening for malignant neoplasm of colon: Secondary | ICD-10-CM

## 2020-06-22 DIAGNOSIS — D120 Benign neoplasm of cecum: Secondary | ICD-10-CM

## 2020-06-22 MED ORDER — SODIUM CHLORIDE 0.9 % IV SOLN: 500.0000 mL | Freq: Once | INTRAVENOUS | Status: DC

## 2020-06-22 NOTE — Op Note (Signed)
Adam Santos: Adam Santos Procedure Date: 06/22/2020 12:46 PM MRN: [id number] Endoscopist: Jackquline Denmark , MD Age: 50 Referring MD:  Date of Birth: 1971/02/16 Gender: Male Account #: [id number] Procedure:                Colonoscopy Indications:              Screening for colorectal malignant neoplasm Medicines:                Monitored Anesthesia Care Procedure:                Pre-Anesthesia Assessment:                           - Prior to the procedure, a History and Physical                            was performed, and patient medications and                            allergies were reviewed. The patient's tolerance of                            previous anesthesia was also reviewed. The risks                            and benefits of the procedure and the sedation                            options and risks were discussed with the patient.                            All questions were answered, and informed consent                            was obtained. Prior Anticoagulants: The patient has                            taken no previous anticoagulant or antiplatelet                            agents. ASA Grade Assessment: II - A patient with                            mild systemic disease. After reviewing the risks                            and benefits, the patient was deemed in                            satisfactory condition to undergo the procedure.                           After obtaining informed consent, the colonoscope  was passed under direct vision. Throughout the                            procedure, the patient's blood pressure, pulse, and                            oxygen saturations were monitored continuously. The                            Olympus CF-HQ190L [id number]) Colonoscope was                            introduced through the anus and advanced to the 2                            cm into the ileum. The  colonoscopy was performed                            without difficulty. The patient tolerated the                            procedure well. The quality of the bowel                            preparation was good. The terminal ileum, ileocecal                            valve, appendiceal orifice, and rectum were                            photographed. Scope In: 1:30:18 PM Scope Out: 1:45:06 PM Scope Withdrawal Time: 0 hours 11 minutes 21 seconds  Total Procedure Duration: 0 hours 14 minutes 48 seconds  Findings:                 A 4 mm polyp was found in the cecum. The polyp was                            sessile. The polyp was removed with a cold biopsy                            forceps. Resection and retrieval were complete.                           A few small-mouthed diverticula were found in the                            sigmoid colon.                           Non-bleeding internal hemorrhoids were found during                            retroflexion. The hemorrhoids were small.  The terminal ileum appeared normal.                           The exam was otherwise without abnormality on                            direct and retroflexion views. Complications:            No immediate complications. Estimated Blood Loss:     Estimated blood loss: none. Impression:               - One 4 mm polyp in the cecum, removed with a cold                            biopsy forceps. Resected and retrieved.                           - Mild sigmoid diverticulosis.                           - Non-bleeding internal hemorrhoids.                           - The examined portion of the ileum was normal.                           - The examination was otherwise normal on direct                            and retroflexion views. Recommendation:           - Patient has a contact number available for                            emergencies. The signs and symptoms of potential                             delayed complications were discussed with the                            patient. Return to normal activities tomorrow.                            Written discharge instructions were provided to the                            patient.                           - High fiber diet.                           - Continue present medications.                           - Await pathology results.                           -  Repeat colonoscopy for surveillance based on                            pathology results.                           - The findings and recommendations were discussed                            with the patient's wife Adam Santos. Jackquline Denmark, MD 06/22/2020 1:54:17 PM This report has been signed electronically.

## 2020-06-22 NOTE — Patient Instructions (Signed)
Handouts given for polyps, diverticulosis and High fiber diet.   YOU HAD AN ENDOSCOPIC PROCEDURE TODAY AT [facility]:   Refer to the procedure report that was given to you for any specific questions about what was found during the examination.  If the procedure report does not answer your questions, please call your gastroenterologist to clarify.  If you requested that your care partner not be given the details of your procedure findings, then the procedure report has been included in a sealed envelope for you to review at your convenience later.  YOU SHOULD EXPECT: Some feelings of bloating in the abdomen. Passage of more gas than usual.  Walking can help get rid of the air that was put into your GI tract during the procedure and reduce the bloating. If you had a lower endoscopy (such as a colonoscopy or flexible sigmoidoscopy) you may notice spotting of blood in your stool or on the toilet paper. If you underwent a bowel prep for your procedure, you may not have a normal bowel movement for a few days.  Please Note:  You might notice some irritation and congestion in your nose or some drainage.  This is from the oxygen used during your procedure.  There is no need for concern and it should clear up in a day or so.  SYMPTOMS TO REPORT IMMEDIATELY:   Following lower endoscopy (colonoscopy or flexible sigmoidoscopy):  Excessive amounts of blood in the stool  Significant tenderness or worsening of abdominal pains  Swelling of the abdomen that is new, acute  Fever of 100F or higher  For urgent or emergent issues, a gastroenterologist can be reached at any hour by calling [phone]. Do not use MyChart messaging for urgent concerns.    DIET:  We do recommend a small meal at first, but then you may proceed to your regular diet.  Drink plenty of fluids but you should avoid alcoholic beverages for 24 hours.  ACTIVITY:  You should plan to take it easy for the rest of today and  you should NOT DRIVE or use heavy machinery until tomorrow (because of the sedation medicines used during the test).    FOLLOW UP: Our staff will call the number listed on your records 48-72 hours following your procedure to check on you and address any questions or concerns that you may have regarding the information given to you following your procedure. If we do not reach you, we will leave a message.  We will attempt to reach you two times.  During this call, we will ask if you have developed any symptoms of COVID 19. If you develop any symptoms (ie: fever, flu-like symptoms, shortness of breath, cough etc.) before then, please call [phone].  If you test positive for Covid 19 in the 2 weeks post procedure, please call and report this information to [place].    If any biopsies were taken you will be contacted by phone or by letter within the next 1-3 weeks.  Please call us at [phone] if you have not heard about the biopsies in 3 weeks.    SIGNATURES/CONFIDENTIALITY: You and/or your care partner have signed paperwork which will be entered into your electronic medical record.  These signatures attest to the fact that that the information above on your After Visit Summary has been reviewed and is understood.  Full responsibility of the confidentiality of this discharge information lies with you and/or your care-partner.

## 2020-06-22 NOTE — Progress Notes (Signed)
Called to room to assist during endoscopic procedure.  Patient ID and intended procedure confirmed with present staff. Received instructions for my participation in the procedure from the performing physician.  

## 2020-06-22 NOTE — Progress Notes (Signed)
Pt's states no medical or surgical changes since previsit or office visit. Vs by CW. 

## 2020-06-22 NOTE — Progress Notes (Signed)
PT taken to PACU. Monitors in place. VSS. Report given to RN. 

## 2020-06-24 ENCOUNTER — Telehealth: Payer: Self-pay

## 2020-06-24 ENCOUNTER — Telehealth: Payer: Self-pay | Admitting: *Deleted

## 2020-06-24 NOTE — Telephone Encounter (Signed)
  Follow up Call-  Call back number 06/22/2020  Post procedure Call Back phone  # [phone]  Permission to leave phone message Yes  Some recent data might be hidden     No answer at 2nd attempt follow up phone call.  Left message on voicemail.

## 2020-06-24 NOTE — Telephone Encounter (Signed)
  Follow up Call-  Call back number 06/22/2020  Post procedure Call Back phone  # [phone]  Permission to leave phone message Yes  Some recent data might be hidden     1st follow up call made.  NALM

## 2020-06-26 ENCOUNTER — Telehealth: Payer: Self-pay | Admitting: *Deleted

## 2020-06-26 DIAGNOSIS — E119 Type 2 diabetes mellitus without complications: Secondary | ICD-10-CM

## 2020-06-26 MED ORDER — METFORMIN HCL ER 500 MG PO TB24: 500.0000 mg | ORAL_TABLET | Freq: Every day | ORAL | 0 refills | Status: DC

## 2020-06-26 MED ORDER — LISINOPRIL-HYDROCHLOROTHIAZIDE 10-12.5 MG PO TABS: 1.0000 | ORAL_TABLET | Freq: Every day | ORAL | 0 refills | Status: DC

## 2020-06-26 NOTE — Telephone Encounter (Signed)
Please call pt and advise him to schedule a f/u appt for DM and BP

## 2020-06-29 MED ORDER — LISINOPRIL-HYDROCHLOROTHIAZIDE 10-12.5 MG PO TABS: 1.0000 | ORAL_TABLET | Freq: Every day | ORAL | 1 refills | Status: DC

## 2020-06-29 MED ORDER — METFORMIN HCL ER 500 MG PO TB24
500.0000 mg | ORAL_TABLET | Freq: Every day | ORAL | 1 refills | Status: DC
Start: 2020-06-29 — End: 2020-08-05

## 2020-06-29 NOTE — Telephone Encounter (Signed)
Patient stated that he is in [place] now and just had to take a day off of work for a colonoscopy and didn't know if there was any way if he could push this follow up appt for DM & BP out until April since it is so hard for him to get off of work? He wanted me to ask this before we put anything on the books. AM

## 2020-06-29 NOTE — Telephone Encounter (Signed)
Appt scheduled for April 22nd 2022. AM

## 2020-06-29 NOTE — Telephone Encounter (Signed)
[place] for April.  meds refilled.

## 2020-07-05 ENCOUNTER — Encounter: Payer: Self-pay | Admitting: Gastroenterology

## 2020-08-03 ENCOUNTER — Other Ambulatory Visit: Payer: Self-pay | Admitting: Family Medicine

## 2020-08-05 ENCOUNTER — Other Ambulatory Visit: Payer: Self-pay | Admitting: *Deleted

## 2020-08-05 DIAGNOSIS — E119 Type 2 diabetes mellitus without complications: Secondary | ICD-10-CM

## 2020-08-05 MED ORDER — METFORMIN HCL ER 500 MG PO TB24: 500.0000 mg | ORAL_TABLET | Freq: Every day | ORAL | 0 refills | Status: DC

## 2020-09-04 ENCOUNTER — Other Ambulatory Visit: Payer: Self-pay

## 2020-09-04 ENCOUNTER — Encounter: Payer: Self-pay | Admitting: Family Medicine

## 2020-09-04 ENCOUNTER — Ambulatory Visit (INDEPENDENT_AMBULATORY_CARE_PROVIDER_SITE_OTHER): Payer: 59 | Admitting: Family Medicine

## 2020-09-04 ENCOUNTER — Other Ambulatory Visit: Payer: Self-pay | Admitting: *Deleted

## 2020-09-04 VITALS — BP 131/88 | HR 66 | Wt 299.0 lb

## 2020-09-04 DIAGNOSIS — Z6839 Body mass index (BMI) 39.0-39.9, adult: Secondary | ICD-10-CM | POA: Diagnosis not present

## 2020-09-04 DIAGNOSIS — I10 Essential (primary) hypertension: Secondary | ICD-10-CM

## 2020-09-04 DIAGNOSIS — E119 Type 2 diabetes mellitus without complications: Secondary | ICD-10-CM

## 2020-09-04 LAB — POCT GLYCOSYLATED HEMOGLOBIN (HGB A1C): Hemoglobin A1C: 5.5 % (ref 4.0–5.6)

## 2020-09-04 NOTE — Progress Notes (Signed)
Established Patient Office Visit  Subjective:  Patient ID: Adam Santos, male    DOB: 1970-09-03  Age: 50 y.o. MRN: [id number]  CC:  Chief Complaint  Patient presents with  . Diabetes    Eye exam done @ [facility] in [place] [phone]  . Hypertension    HPI Adam Santos presents for   Hypertension- Pt denies chest pain, SOB, dizziness, or heart palpitations.  Taking meds as directed w/o problems.  Denies medication side effects.  Reports home blood pressures usually in the low 120s over low 80s.  Diabetes - no hypoglycemic events. No wounds or sores that are not healing well. No increased thirst or urination. Checking glucose at home. Taking medications as prescribed without any side effects.  Had his eye exam done in [place].  He is still commuting up [place] for work and has been working on [employer] by [employer] that he works for.  This is a dream come true but is also been very stressful.  He has not been exercising recently but says he plans to get back on track he has gained some weight back.  But again plans to start working on that.    Interested in getting the first shingles vaccine today.  Past Medical History:  Diagnosis Date  . Diabetes mellitus without complication ([place])   . Hyperlipidemia   . Hypertension   . Undescended testicle     Past Surgical History:  Procedure Laterality Date  . TESTICLE SURGERY    . tonsills removed      Family History  Problem Relation Age of Onset  . Heart attack Father   . Hypertension Father   . Colon cancer Neg Hx   . Esophageal cancer Neg Hx   . Rectal cancer Neg Hx   . Stomach cancer Neg Hx   . Colon polyps Neg Hx     Social History   Socioeconomic History  . Marital status: Married    Spouse name: Not on file  . Number of children: Not on file  . Years of education: Not on file  . Highest education level: Not on file  Occupational History  . Not on file  Tobacco Use  . Smoking status:  Light Tobacco Smoker    Types: Cigars  . Smokeless tobacco: Never Used  Vaping Use  . Vaping Use: Never used  Substance and Sexual Activity  . Alcohol use: Yes    Alcohol/week: 2.0 standard drinks    Types: 2 Standard drinks or equivalent per week  . Drug use: No  . Sexual activity: Yes    Partners: Female  Other Topics Concern  . Not on file  Social History Narrative  . Not on file   Social Determinants of Health   Financial Resource Strain: Not on file  Food Insecurity: Not on file  Transportation Needs: Not on file  Physical Activity: Not on file  Stress: Not on file  Social Connections: Not on file  Intimate Partner Violence: Not on file    Outpatient Medications Prior to Visit  Medication Sig Dispense Refill  . aspirin EC 81 MG tablet Take 81 mg by mouth daily. Swallow whole.    Marland Kitchen atorvastatin (LIPITOR) 20 MG tablet Take 1 tablet (20 mg total) by mouth at bedtime. 90 tablet 3  . blood glucose meter kit and supplies Dispense based on patient and insurance preference. For testing blood sugars daily. Dx: E11.9 1 each 0  . KRILL OIL  OMEGA-3 PO Take by mouth.    . Lancets (ONETOUCH DELICA PLUS [id number]) MISC USE TO TEST QD    . lisinopril-hydrochlorothiazide (ZESTORETIC) 10-12.5 MG tablet Take 1 tablet by mouth daily. 30 tablet 1  . metFORMIN (GLUCOPHAGE-XR) 500 MG 24 hr tablet Take 1 tablet (500 mg total) by mouth daily with breakfast. 30 tablet 0  . metroNIDAZOLE (METROGEL) 0.75 % gel Apply 1 application topically 2 (two) times daily. 45 g 3  . ONE TOUCH ULTRA TEST test strip USE TO TEST QD     No facility-administered medications prior to visit.    No Known Allergies  ROS Review of Systems    Objective:    Physical Exam Constitutional:      Appearance: He is well-developed.  HENT:     Head: Normocephalic and atraumatic.  Cardiovascular:     Rate and Rhythm: Normal rate and regular rhythm.     Heart sounds: Normal heart sounds.  Pulmonary:     Effort:  Pulmonary effort is normal.     Breath sounds: Normal breath sounds.  Skin:    General: Skin is warm and dry.     Comments: +sunburned arms and face  Neurological:     Mental Status: He is alert and oriented to person, place, and time.  Psychiatric:        Behavior: Behavior normal.     BP 131/88   Pulse 66   Wt 299 lb (135.6 kg)   SpO2 98%   BMI 39.45 kg/m  Wt Readings from Last 3 Encounters:  09/04/20 299 lb (135.6 kg)  06/22/20 287 lb (130.2 kg)  06/08/20 287 lb (130.2 kg)     Health Maintenance Due  Topic Date Due  . OPHTHALMOLOGY EXAM  Never done    There are no preventive care reminders to display for this patient.  Lab Results  Component Value Date   TSH 1.57 06/22/2018   Lab Results  Component Value Date   WBC 7.0 01/16/2014   HGB 14.7 01/16/2014   HCT 42.3 01/16/2014   MCV 91.4 01/16/2014   PLT 225 01/16/2014   Lab Results  Component Value Date   NA 138 01/06/2020   K 3.8 01/06/2020   CO2 27 01/06/2020   GLUCOSE 126 (H) 01/06/2020   BUN 18 01/06/2020   CREATININE 0.98 01/06/2020   BILITOT 0.6 01/06/2020   ALKPHOS 56 12/27/2016   AST 20 01/06/2020   ALT 25 01/06/2020   PROT 7.0 01/06/2020   ALBUMIN 4.2 12/27/2016   CALCIUM 9.9 01/06/2020   Lab Results  Component Value Date   CHOL 227 (H) 01/06/2020   Lab Results  Component Value Date   HDL 35 (L) 01/06/2020   Lab Results  Component Value Date   LDLCALC 140 (H) 01/06/2020   Lab Results  Component Value Date   TRIG 337 (H) 01/06/2020   Lab Results  Component Value Date   CHOLHDL 6.5 (H) 01/06/2020   Lab Results  Component Value Date   HGBA1C 5.5 09/04/2020      Assessment & Plan:   Problem List Items Addressed This Visit      Cardiovascular and Mediastinum   Hypertension    Well controlled. Continue current regimen. Follow up in  49m o         Endocrine   Type 2 diabetes mellitus without complication, without long-term current use of insulin ([employer]) - Primary    Well  controlled. Continue current regimen. Follow up in  4  mo. Will call for eye exam done in [place].       Relevant Orders   POCT glycosylated hemoglobin (Hb A1C) (Completed)     Other   BMI 39.0-39.9,adult    Continue to work on healthy diet and discussed starting back on exercise routine.           Metabolic panel ordered today.  Results still pending.  No orders of the defined types were placed in this encounter.   Follow-up: Return in about 4 months (around 01/04/2021) for Diabetes follow-up and 2nd Shingrix vaccine .    Beatrice Lecher, MD

## 2020-09-04 NOTE — Assessment & Plan Note (Signed)
Well controlled. Continue current regimen. Follow up in  6 mo  

## 2020-09-04 NOTE — Assessment & Plan Note (Signed)
Well controlled. Continue current regimen. Follow up in  4 mo. Will call for eye exam done in [place].

## 2020-09-04 NOTE — Assessment & Plan Note (Signed)
Continue to work on [employer] and discussed starting back on exercise routine.

## 2020-09-05 LAB — BASIC METABOLIC PANEL WITH GFR
BUN: 16 mg/dL (ref 7–25)
CO2: 29 mmol/L (ref 20–32)
Calcium: 9.5 mg/dL (ref 8.6–10.3)
Chloride: 102 mmol/L (ref 98–110)
Creat: 0.97 mg/dL (ref 0.70–1.33)
GFR, Est African American: 104 mL/min/{1.73_m2} (ref >=60)
GFR, Est Non African American: 90 mL/min/{1.73_m2} (ref >=60)
Glucose, Bld: 117 mg/dL — ABNORMAL HIGH (ref 65–99)
Potassium: 4.3 mmol/L (ref 3.5–5.3)
Sodium: 138 mmol/L (ref 135–146)

## 2020-09-07 NOTE — Progress Notes (Signed)
All labs are normal. 

## 2020-10-08 ENCOUNTER — Other Ambulatory Visit: Payer: Self-pay | Admitting: *Deleted

## 2020-10-08 DIAGNOSIS — E119 Type 2 diabetes mellitus without complications: Secondary | ICD-10-CM

## 2020-10-08 MED ORDER — METFORMIN HCL ER 500 MG PO TB24: 500.0000 mg | ORAL_TABLET | Freq: Every day | ORAL | 0 refills | Status: DC

## 2020-10-08 MED ORDER — LISINOPRIL-HYDROCHLOROTHIAZIDE 10-12.5 MG PO TABS: 1.0000 | ORAL_TABLET | Freq: Every day | ORAL | 0 refills | Status: DC

## 2021-01-15 ENCOUNTER — Other Ambulatory Visit: Payer: Self-pay | Admitting: *Deleted

## 2021-01-15 MED ORDER — ATORVASTATIN CALCIUM 20 MG PO TABS: 20.0000 mg | ORAL_TABLET | Freq: Every day | ORAL | 0 refills | Status: DC

## 2021-01-16 ENCOUNTER — Other Ambulatory Visit: Payer: Self-pay | Admitting: Family Medicine

## 2021-01-19 NOTE — Telephone Encounter (Signed)
Call pt he is due for f/u for bp, lab and DM thanks.

## 2021-01-20 NOTE — Telephone Encounter (Signed)
LVM for patient to call us back to get appointment scheduled. AM

## 2021-02-02 LAB — LIPID PANEL
Cholesterol: 184 (ref 0–200)
HDL: 37 (ref 35–70)
LDL Cholesterol: 108
Triglycerides: 194 — AB (ref 40–160)

## 2021-02-02 LAB — BASIC METABOLIC PANEL
BUN: 18 (ref 4–21)
Creatinine: 1 (ref 0.6–1.3)
Glucose: 120

## 2021-02-02 LAB — CEA: CEA: 1.4

## 2021-02-02 LAB — HEPATIC FUNCTION PANEL
ALT: 32 (ref 10–40)
AST: 29 (ref 14–40)
Alkaline Phosphatase: 52 (ref 25–125)
Bilirubin, Total: 0.8

## 2021-02-02 LAB — COMPREHENSIVE METABOLIC PANEL: Albumin: 4.4 (ref 3.5–5.0)

## 2021-02-02 LAB — CBC AND DIFFERENTIAL: Hemoglobin: 14.7 (ref 13.5–17.5)

## 2021-02-02 LAB — PSA: PSA: 0.91

## 2021-02-02 LAB — NT-PROBNP: NT-proBNP: 77

## 2021-02-02 LAB — GAMMA GT: GGT: 70

## 2021-02-02 LAB — HEMOGLOBIN A1C: Hemoglobin A1C: 6

## 2021-02-15 ENCOUNTER — Ambulatory Visit: Payer: 59 | Admitting: Family Medicine

## 2021-02-21 ENCOUNTER — Other Ambulatory Visit: Payer: Self-pay | Admitting: Family Medicine

## 2021-02-22 ENCOUNTER — Ambulatory Visit (INDEPENDENT_AMBULATORY_CARE_PROVIDER_SITE_OTHER): Payer: 59 | Admitting: Family Medicine

## 2021-02-22 ENCOUNTER — Ambulatory Visit: Payer: 59 | Admitting: Family Medicine

## 2021-02-22 ENCOUNTER — Encounter: Payer: Self-pay | Admitting: Family Medicine

## 2021-02-22 ENCOUNTER — Other Ambulatory Visit: Payer: Self-pay

## 2021-02-22 VITALS — BP 136/87 | HR 76 | Ht 73.0 in | Wt 307.0 lb

## 2021-02-22 DIAGNOSIS — E119 Type 2 diabetes mellitus without complications: Secondary | ICD-10-CM

## 2021-02-22 DIAGNOSIS — E781 Pure hyperglyceridemia: Secondary | ICD-10-CM

## 2021-02-22 DIAGNOSIS — Z23 Encounter for immunization: Secondary | ICD-10-CM | POA: Diagnosis not present

## 2021-02-22 DIAGNOSIS — I10 Essential (primary) hypertension: Secondary | ICD-10-CM | POA: Diagnosis not present

## 2021-02-22 DIAGNOSIS — F439 Reaction to severe stress, unspecified: Secondary | ICD-10-CM

## 2021-02-22 DIAGNOSIS — Z125 Encounter for screening for malignant neoplasm of prostate: Secondary | ICD-10-CM

## 2021-02-22 LAB — POCT GLYCOSYLATED HEMOGLOBIN (HGB A1C): Hemoglobin A1C: 5.8 % — AB (ref 4.0–5.6)

## 2021-02-22 MED ORDER — LISINOPRIL-HYDROCHLOROTHIAZIDE 20-25 MG PO TABS: 1.0000 | ORAL_TABLET | Freq: Every day | ORAL | 1 refills | Status: DC

## 2021-02-22 NOTE — Assessment & Plan Note (Signed)
Tolerating statin well without any significant problems.

## 2021-02-22 NOTE — Assessment & Plan Note (Signed)
A1c is 5.8 today.  Again just continue to work on [employer] he commutes and travels up [place] to work during [employer].  Just encouraged him to try to get back on track with healthy food choices and diet. F/U in 6 mo   Lab Results  Component Value Date   HGBA1C 5.8 (A) 02/22/2021

## 2021-02-22 NOTE — Assessment & Plan Note (Signed)
Overall mentally he feels like he is actually coping really well but has noticed some more physical signs of stress so we discussed ways to reduce stress overall discussed a few techniques that could be helpful.  Anyone working with a therapist or counselor can also be helpful.  Being mindful at the start of the days that he is feeling very focused.

## 2021-02-22 NOTE — Progress Notes (Signed)
Established Patient Office Visit  Subjective:  Patient ID: Adam Santos, male    DOB: Aug 24, 1970  Age: 50 y.o. MRN: [id number]  CC:  Chief Complaint  Patient presents with   Diabetes   Hypertension    HPI Adam Santos presents for   Hypertension- Pt denies chest pain, SOB, dizziness, or heart palpitations.  Taking meds as directed w/o problems.  Denies medication side effects.    Diabetes - no hypoglycemic events. No wounds or sores that are not healing well. No increased thirst or urination. Checking glucose at home. Taking medications as prescribed without any side effects.  Weight has been increased.  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 He feels that mentally he he coping well but feels like physicall he has noticed at times that he can tell when his blood pressure is elevated.   Past Medical History:  Diagnosis Date   Diabetes mellitus without complication ([place])    Hyperlipidemia    Hypertension    Undescended testicle     Past Surgical History:  Procedure Laterality Date   TESTICLE SURGERY     tonsills removed      Family History  Problem Relation Age of Onset   Heart attack Father    Hypertension Father    Colon cancer Neg Hx    Esophageal cancer Neg Hx    Rectal cancer Neg Hx    Stomach cancer Neg Hx    Colon polyps Neg Hx     Social History   Socioeconomic History   Marital status: Married    Spouse name: Not on file   Number of children: Not on file   Years of education: Not on file   Highest education level: Not on file  Occupational History   Not on file  Tobacco Use   Smoking status: Light Smoker    Types: Cigars   Smokeless tobacco: Never  Vaping Use   Vaping Use: Never used  Substance and Sexual Activity   Alcohol use: Yes    Alcohol/week: 2.0 standard drinks    Types: 2 Standard drinks or equivalent per week   Drug use: No   Sexual activity: Yes    Partners: Female   Other Topics Concern   Not on file  Social History Narrative   Not on file   Social Determinants of Health   Financial Resource Strain: Not on file  Food Insecurity: Not on file  Transportation Needs: Not on file  Physical Activity: Not on file  Stress: Not on file  Social Connections: Not on file  Intimate Partner Violence: Not on file    Outpatient Medications Prior to Visit  Medication Sig Dispense Refill   aspirin EC 81 MG tablet Take 81 mg by mouth daily. Swallow whole.     atorvastatin (LIPITOR) 20 MG tablet Take 1 tablet (20 mg total) by mouth at bedtime. LAST REFILL. LABS ARE REQUIRED FOR ADDITIONAL REFILLS 90 tablet 0   blood glucose meter kit and supplies Dispense based on patient and insurance preference. For testing blood sugars daily. Dx: E11.9 1 each 0   KRILL OIL OMEGA-3 PO Take by mouth.     Lancets (ONETOUCH DELICA PLUS [id number]) MISC USE TO TEST QD     metFORMIN (GLUCOPHAGE-XR) 500 MG 24 hr tablet Take 1 tablet (500 mg total) by mouth daily with breakfast. 90 tablet 0   metroNIDAZOLE (METROGEL) 0.75 % gel Apply  1 application topically 2 (two) times daily. 45 g 3   ONE TOUCH ULTRA TEST test strip USE TO TEST QD     lisinopril-hydrochlorothiazide (ZESTORETIC) 10-12.5 MG tablet Take 1 tablet by mouth daily. 30 days given. Please call office and schedule an appointment for future refills 30 tablet 0   No facility-administered medications prior to visit.    No Known Allergies  ROS Review of Systems    Objective:    Physical Exam Constitutional:      Appearance: Normal appearance. He is well-developed.  HENT:     Head: Normocephalic and atraumatic.  Cardiovascular:     Rate and Rhythm: Normal rate and regular rhythm.     Heart sounds: Normal heart sounds.  Pulmonary:     Effort: Pulmonary effort is normal.     Breath sounds: Normal breath sounds.  Skin:    General: Skin is warm and dry.  Neurological:     Mental Status: He is alert and oriented to  person, place, and time. Mental status is at baseline.  Psychiatric:        Behavior: Behavior normal.   BP 136/87   Pulse 76   Ht $R'6\' 1"'Gd$  (1.854 m)   Wt (!) 307 lb (139.3 kg)   SpO2 97%   BMI 40.50 kg/m  Wt Readings from Last 3 Encounters:  02/22/21 (!) 307 lb (139.3 kg)  09/04/20 299 lb (135.6 kg)  06/22/20 287 lb (130.2 kg)     Health Maintenance Due  Topic Date Due   COVID-19 Vaccine (1) Never done   OPHTHALMOLOGY EXAM  Never done   INFLUENZA VACCINE  12/14/2020    There are no preventive care reminders to display for this patient.  Lab Results  Component Value Date   TSH 1.57 06/22/2018   Lab Results  Component Value Date   WBC 7.0 01/16/2014   HGB 14.7 01/16/2014   HCT 42.3 01/16/2014   MCV 91.4 01/16/2014   PLT 225 01/16/2014   Lab Results  Component Value Date   NA 138 09/04/2020   K 4.3 09/04/2020   CO2 29 09/04/2020   GLUCOSE 117 (H) 09/04/2020   BUN 16 09/04/2020   CREATININE 0.97 09/04/2020   BILITOT 0.6 01/06/2020   ALKPHOS 56 12/27/2016   AST 20 01/06/2020   ALT 25 01/06/2020   PROT 7.0 01/06/2020   ALBUMIN 4.2 12/27/2016   CALCIUM 9.5 09/04/2020   Lab Results  Component Value Date   CHOL 227 (H) 01/06/2020   Lab Results  Component Value Date   HDL 35 (L) 01/06/2020   Lab Results  Component Value Date   LDLCALC 140 (H) 01/06/2020   Lab Results  Component Value Date   TRIG 337 (H) 01/06/2020   Lab Results  Component Value Date   CHOLHDL 6.5 (H) 01/06/2020   Lab Results  Component Value Date   HGBA1C 5.8 (A) 02/22/2021      Assessment & Plan:   Problem List Items Addressed This Visit       Cardiovascular and Mediastinum   Hypertension    Blood pressure is little borderline elevated today but he reports he has been getting some pressures, all over the place at home and is okay with going back up on the lisinopril to the 20/12.5.  New prescription sent to the pharmacy.  Follow-up in 6 months.      Relevant Medications    lisinopril-hydrochlorothiazide (ZESTORETIC) 20-25 MG tablet   Other Relevant Orders   POCT  glycosylated hemoglobin (Hb A1C) (Completed)     Endocrine   Type 2 diabetes mellitus without complication, without long-term current use of insulin ([facility]) - Primary    A1c is 5.8 today.  Again just continue to work on [employer] he commutes and travels up [place] to work during [employer].  Just encouraged him to try to get back on track with healthy food choices and diet. F/U in 6 mo   Lab Results  Component Value Date   HGBA1C 5.8 (A) 02/22/2021         Relevant Medications   lisinopril-hydrochlorothiazide (ZESTORETIC) 20-25 MG tablet   Other Relevant Orders   POCT glycosylated hemoglobin (Hb A1C) (Completed)     Other   Stress    Overall mentally he feels like he is actually coping really well but has noticed some more physical signs of stress so we discussed ways to reduce stress overall discussed a few techniques that could be helpful.  Anyone working with a therapist or counselor can also be helpful.  Being mindful at the start of the days that he is feeling very focused.      HYPERTRIGLYCERIDEMIA    Tolerating statin well without any significant problems.      Relevant Medications   lisinopril-hydrochlorothiazide (ZESTORETIC) 20-25 MG tablet   Other Visit Diagnoses     Screening for prostate cancer       Relevant Orders   POCT glycosylated hemoglobin (Hb A1C) (Completed)   Need for Zostavax administration       Relevant Orders   Varicella-zoster vaccine IM (Shingrix) (Completed)      Has had 2nd Shingrix but declines flu shot.    Recently had labs done for insurance. He will forward those to me.    Meds ordered this encounter  Medications   lisinopril-hydrochlorothiazide (ZESTORETIC) 20-25 MG tablet    Sig: Take 1 tablet by mouth daily.    Dispense:  90 tablet    Refill:  1     Follow-up: Return in about 6 months (around 08/23/2021) for Diabetes  follow-up, Hypertension.    Beatrice Lecher, MD

## 2021-02-22 NOTE — Assessment & Plan Note (Signed)
Blood pressure is little borderline elevated today but he reports he has been getting some pressures, all over the place at home and is okay with going back up on the lisinopril to the 20/12.5.  New prescription sent to the pharmacy.  Follow-up in 6 months.

## 2021-02-26 ENCOUNTER — Other Ambulatory Visit: Payer: Self-pay | Admitting: Family Medicine

## 2021-02-26 DIAGNOSIS — E119 Type 2 diabetes mellitus without complications: Secondary | ICD-10-CM

## 2021-02-26 MED ORDER — METFORMIN HCL ER 500 MG PO TB24: 500.0000 mg | ORAL_TABLET | Freq: Every day | ORAL | 0 refills | Status: DC

## 2021-03-04 ENCOUNTER — Encounter: Payer: Self-pay | Admitting: Family Medicine

## 2021-03-05 NOTE — Telephone Encounter (Signed)
Pls abstract lab results.

## 2021-04-14 ENCOUNTER — Other Ambulatory Visit: Payer: Self-pay | Admitting: Family Medicine

## 2021-05-26 ENCOUNTER — Other Ambulatory Visit: Payer: Self-pay | Admitting: Family Medicine

## 2021-05-26 DIAGNOSIS — E119 Type 2 diabetes mellitus without complications: Secondary | ICD-10-CM

## 2021-07-09 ENCOUNTER — Other Ambulatory Visit: Payer: Self-pay

## 2021-07-09 DIAGNOSIS — I10 Essential (primary) hypertension: Secondary | ICD-10-CM

## 2021-07-09 MED ORDER — LISINOPRIL-HYDROCHLOROTHIAZIDE 20-25 MG PO TABS: 1.0000 | ORAL_TABLET | Freq: Every day | ORAL | 1 refills | Status: DC

## 2021-08-31 ENCOUNTER — Other Ambulatory Visit: Payer: Self-pay | Admitting: Family Medicine

## 2021-08-31 DIAGNOSIS — E119 Type 2 diabetes mellitus without complications: Secondary | ICD-10-CM

## 2021-09-21 ENCOUNTER — Encounter: Payer: Self-pay | Admitting: Family Medicine

## 2021-09-21 DIAGNOSIS — G459 Transient cerebral ischemic attack, unspecified: Secondary | ICD-10-CM

## 2021-09-21 NOTE — Telephone Encounter (Signed)
Ordered.  Placed for [employer] as we do not do those in [place].  But we will see if we can get the date that he is requesting.  He also needs to schedule hospital follow-up.  Okay for virtual if that is what he needs. ?

## 2021-09-23 ENCOUNTER — Ambulatory Visit (HOSPITAL_BASED_OUTPATIENT_CLINIC_OR_DEPARTMENT_OTHER): Admission: RE | Admit: 2021-09-23 | Payer: 59 | Source: Ambulatory Visit

## 2021-10-01 ENCOUNTER — Other Ambulatory Visit: Payer: Self-pay | Admitting: Family Medicine

## 2021-10-01 ENCOUNTER — Ambulatory Visit (HOSPITAL_BASED_OUTPATIENT_CLINIC_OR_DEPARTMENT_OTHER)
Admission: RE | Admit: 2021-10-01 | Discharge: 2021-10-01 | Disposition: A | Discharge status: Home / Self Care | Payer: 59 | Source: Ambulatory Visit | Attending: Family Medicine | Admitting: Family Medicine

## 2021-10-01 DIAGNOSIS — E119 Type 2 diabetes mellitus without complications: Secondary | ICD-10-CM

## 2021-10-01 DIAGNOSIS — G459 Transient cerebral ischemic attack, unspecified: Secondary | ICD-10-CM | POA: Diagnosis present

## 2021-10-01 NOTE — Progress Notes (Signed)
  Echocardiogram 2D Echocardiogram has been performed.  Merrie Roof F 10/01/2021, 11:17 AM

## 2021-10-02 LAB — ECHOCARDIOGRAM COMPLETE
AR max vel: 2.9 cm2
AV Area VTI: 3.16 cm2
AV Area mean vel: 2.86 cm2
AV Mean grad: 3 mmHg
AV Peak grad: 6.7 mmHg
Ao pk vel: 1.29 m/s
Area-P 1/2: 3.37 cm2
S' Lateral: 3.2 cm

## 2021-10-04 NOTE — Progress Notes (Signed)
HI Kaymon,  The echocardiogram of your heart overall looks good.  Normal mechanical function.

## 2021-10-18 ENCOUNTER — Encounter: Payer: Self-pay | Admitting: Family Medicine

## 2021-10-18 DIAGNOSIS — I10 Essential (primary) hypertension: Secondary | ICD-10-CM

## 2021-10-18 MED ORDER — LISINOPRIL-HYDROCHLOROTHIAZIDE 20-25 MG PO TABS: 1.0000 | ORAL_TABLET | Freq: Every day | ORAL | 0 refills | Status: DC

## 2022-01-01 ENCOUNTER — Other Ambulatory Visit: Payer: Self-pay | Admitting: Family Medicine

## 2022-01-01 DIAGNOSIS — E119 Type 2 diabetes mellitus without complications: Secondary | ICD-10-CM

## 2022-01-14 ENCOUNTER — Telehealth: Payer: Self-pay

## 2022-01-14 DIAGNOSIS — I10 Essential (primary) hypertension: Secondary | ICD-10-CM

## 2022-01-14 MED ORDER — LISINOPRIL-HYDROCHLOROTHIAZIDE 20-25 MG PO TABS: 1.0000 | ORAL_TABLET | Freq: Every day | ORAL | 0 refills | Status: DC

## 2022-01-14 NOTE — Telephone Encounter (Signed)
Pt lvm requesting Lisinopril HCTZ refill.    Last OV: 02/22/21.

## 2022-01-14 NOTE — Telephone Encounter (Signed)
Meds ordered this encounter  Medications   lisinopril-hydrochlorothiazide (ZESTORETIC) 20-25 MG tablet    Sig: Take 1 tablet by mouth daily.    Dispense:  30 tablet    Refill:  0

## 2022-02-03 ENCOUNTER — Other Ambulatory Visit: Payer: Self-pay | Admitting: Family Medicine

## 2022-02-03 DIAGNOSIS — E119 Type 2 diabetes mellitus without complications: Secondary | ICD-10-CM

## 2022-02-08 NOTE — Telephone Encounter (Signed)
Call pt he will need an OV and labs for future refills. 30 day supply sent for now. Thanks.

## 2022-02-09 ENCOUNTER — Telehealth: Payer: Self-pay

## 2022-02-09 DIAGNOSIS — I10 Essential (primary) hypertension: Secondary | ICD-10-CM

## 2022-02-09 MED ORDER — LISINOPRIL-HYDROCHLOROTHIAZIDE 20-25 MG PO TABS: 1.0000 | ORAL_TABLET | Freq: Every day | ORAL | 0 refills | Status: DC

## 2022-02-09 NOTE — Telephone Encounter (Signed)
Medication sent. My chart message sent to patient informing him of this.

## 2022-02-09 NOTE — Telephone Encounter (Signed)
Called the patient schedule an appointment for med refills,and labs, and he wanted to know if you could send a Rx for approx. 10 pills to hold him over until he sees you for the appointment. tvt   lisinopril-hydrochlorothiazide (ZESTORETIC) 20-25 MG tablet

## 2022-02-09 NOTE — Telephone Encounter (Signed)
Scheduled 02/25/2022. Tvt

## 2022-02-11 ENCOUNTER — Other Ambulatory Visit: Payer: Self-pay | Admitting: Family Medicine

## 2022-02-11 DIAGNOSIS — I10 Essential (primary) hypertension: Secondary | ICD-10-CM

## 2022-02-25 ENCOUNTER — Ambulatory Visit: Payer: 59 | Admitting: Family Medicine

## 2022-02-28 ENCOUNTER — Other Ambulatory Visit: Payer: Self-pay | Admitting: Family Medicine

## 2022-02-28 DIAGNOSIS — I10 Essential (primary) hypertension: Secondary | ICD-10-CM

## 2022-03-15 ENCOUNTER — Other Ambulatory Visit: Payer: Self-pay | Admitting: Family Medicine

## 2022-03-15 DIAGNOSIS — E119 Type 2 diabetes mellitus without complications: Secondary | ICD-10-CM

## 2022-03-30 ENCOUNTER — Other Ambulatory Visit: Payer: Self-pay | Admitting: Family Medicine

## 2022-03-30 DIAGNOSIS — E119 Type 2 diabetes mellitus without complications: Secondary | ICD-10-CM

## 2022-06-04 ENCOUNTER — Other Ambulatory Visit: Payer: Self-pay | Admitting: Family Medicine
